# Patient Record
Sex: Female | Born: 1950 | Race: White | Hispanic: No | Marital: Single | Smoking: Never smoker
Health system: Southern US, Community
[De-identification: ages and names within clinical notes are randomized; demographics above are authoritative.]

## PROBLEM LIST (undated history)

## (undated) DIAGNOSIS — K219 Gastro-esophageal reflux disease without esophagitis: Secondary | ICD-10-CM

## (undated) DIAGNOSIS — S42209A Unspecified fracture of upper end of unspecified humerus, initial encounter for closed fracture: Secondary | ICD-10-CM

## (undated) DIAGNOSIS — I10 Essential (primary) hypertension: Secondary | ICD-10-CM

## (undated) DIAGNOSIS — J45909 Unspecified asthma, uncomplicated: Secondary | ICD-10-CM

## (undated) DIAGNOSIS — S52502A Unspecified fracture of the lower end of left radius, initial encounter for closed fracture: Secondary | ICD-10-CM

## (undated) DIAGNOSIS — Z923 Personal history of irradiation: Secondary | ICD-10-CM

## (undated) HISTORY — PX: BREAST SURGERY: SHX581

## (undated) HISTORY — PX: BREAST LUMPECTOMY: SHX2

## (undated) HISTORY — PX: CHOLECYSTECTOMY: SHX55

## (undated) HISTORY — PX: DILATION AND CURETTAGE OF UTERUS: SHX78

---

## 1997-09-26 ENCOUNTER — Other Ambulatory Visit: Admission: RE | Admit: 1997-09-26 | Discharge: 1997-09-26 | Payer: Self-pay | Admitting: Obstetrics and Gynecology

## 1998-04-28 HISTORY — PX: BREAST LUMPECTOMY: SHX2

## 1998-09-18 ENCOUNTER — Other Ambulatory Visit: Admission: RE | Admit: 1998-09-18 | Discharge: 1998-09-18 | Payer: Self-pay | Admitting: Radiology

## 1998-10-02 ENCOUNTER — Other Ambulatory Visit: Admission: RE | Admit: 1998-10-02 | Discharge: 1998-10-02 | Payer: Self-pay | Admitting: Obstetrics and Gynecology

## 1998-10-02 ENCOUNTER — Other Ambulatory Visit: Admission: RE | Admit: 1998-10-02 | Discharge: 1998-10-02 | Payer: Self-pay | Admitting: *Deleted

## 1998-10-11 ENCOUNTER — Ambulatory Visit (HOSPITAL_COMMUNITY): Admission: RE | Admit: 1998-10-11 | Discharge: 1998-10-11 | Payer: Self-pay | Admitting: *Deleted

## 1998-11-02 ENCOUNTER — Ambulatory Visit (HOSPITAL_COMMUNITY): Admission: RE | Admit: 1998-11-02 | Discharge: 1998-11-02 | Payer: Self-pay | Admitting: *Deleted

## 1998-12-18 ENCOUNTER — Encounter: Admission: RE | Admit: 1998-12-18 | Discharge: 1999-03-18 | Payer: Self-pay | Admitting: *Deleted

## 1999-10-03 ENCOUNTER — Other Ambulatory Visit: Admission: RE | Admit: 1999-10-03 | Discharge: 1999-10-03 | Payer: Self-pay | Admitting: Obstetrics and Gynecology

## 1999-11-13 ENCOUNTER — Ambulatory Visit (HOSPITAL_COMMUNITY): Admission: RE | Admit: 1999-11-13 | Discharge: 1999-11-13 | Payer: Self-pay | Admitting: Obstetrics and Gynecology

## 1999-11-13 ENCOUNTER — Encounter (INDEPENDENT_AMBULATORY_CARE_PROVIDER_SITE_OTHER): Payer: Self-pay | Admitting: Specialist

## 2000-05-20 ENCOUNTER — Ambulatory Visit (HOSPITAL_COMMUNITY): Admission: RE | Admit: 2000-05-20 | Discharge: 2000-05-20 | Payer: Self-pay | Admitting: Internal Medicine

## 2000-08-20 ENCOUNTER — Encounter: Payer: Self-pay | Admitting: Hematology and Oncology

## 2000-08-20 ENCOUNTER — Ambulatory Visit (HOSPITAL_COMMUNITY): Admission: RE | Admit: 2000-08-20 | Discharge: 2000-08-20 | Payer: Self-pay | Admitting: Hematology and Oncology

## 2000-10-13 ENCOUNTER — Other Ambulatory Visit: Admission: RE | Admit: 2000-10-13 | Discharge: 2000-10-13 | Payer: Self-pay | Admitting: Obstetrics and Gynecology

## 2001-10-19 ENCOUNTER — Other Ambulatory Visit: Admission: RE | Admit: 2001-10-19 | Discharge: 2001-10-19 | Payer: Self-pay | Admitting: Obstetrics and Gynecology

## 2002-01-04 ENCOUNTER — Ambulatory Visit (HOSPITAL_COMMUNITY): Admission: RE | Admit: 2002-01-04 | Discharge: 2002-01-04 | Payer: Self-pay | Admitting: *Deleted

## 2002-11-09 ENCOUNTER — Other Ambulatory Visit: Admission: RE | Admit: 2002-11-09 | Discharge: 2002-11-09 | Payer: Self-pay | Admitting: Obstetrics and Gynecology

## 2003-10-06 ENCOUNTER — Ambulatory Visit (HOSPITAL_COMMUNITY): Admission: RE | Admit: 2003-10-06 | Discharge: 2003-10-06 | Payer: Self-pay | Admitting: Oncology

## 2003-10-19 ENCOUNTER — Encounter: Admission: RE | Admit: 2003-10-19 | Discharge: 2003-10-19 | Payer: Self-pay | Admitting: Family Medicine

## 2003-11-09 ENCOUNTER — Other Ambulatory Visit: Admission: RE | Admit: 2003-11-09 | Discharge: 2003-11-09 | Payer: Self-pay | Admitting: Obstetrics and Gynecology

## 2003-12-11 ENCOUNTER — Ambulatory Visit (HOSPITAL_COMMUNITY): Admission: RE | Admit: 2003-12-11 | Discharge: 2003-12-11 | Payer: Self-pay | Admitting: *Deleted

## 2003-12-11 ENCOUNTER — Encounter (INDEPENDENT_AMBULATORY_CARE_PROVIDER_SITE_OTHER): Payer: Self-pay | Admitting: Specialist

## 2004-11-13 ENCOUNTER — Other Ambulatory Visit: Admission: RE | Admit: 2004-11-13 | Discharge: 2004-11-13 | Payer: Self-pay | Admitting: Obstetrics and Gynecology

## 2006-09-10 ENCOUNTER — Other Ambulatory Visit: Admission: RE | Admit: 2006-09-10 | Discharge: 2006-09-10 | Payer: Self-pay | Admitting: Family Medicine

## 2007-09-13 ENCOUNTER — Other Ambulatory Visit: Admission: RE | Admit: 2007-09-13 | Discharge: 2007-09-13 | Payer: Self-pay | Admitting: Family Medicine

## 2008-09-18 ENCOUNTER — Other Ambulatory Visit: Admission: RE | Admit: 2008-09-18 | Discharge: 2008-09-18 | Payer: Self-pay | Admitting: Family Medicine

## 2009-09-18 ENCOUNTER — Other Ambulatory Visit: Admission: RE | Admit: 2009-09-18 | Discharge: 2009-09-18 | Payer: Self-pay | Admitting: Family Medicine

## 2010-08-01 ENCOUNTER — Other Ambulatory Visit: Payer: Self-pay | Admitting: Family Medicine

## 2010-08-01 DIAGNOSIS — Z9889 Other specified postprocedural states: Secondary | ICD-10-CM

## 2010-08-01 DIAGNOSIS — Z1231 Encounter for screening mammogram for malignant neoplasm of breast: Secondary | ICD-10-CM

## 2010-09-13 NOTE — Op Note (Signed)
NAMECHRISTINE, Theresa Booth                            ACCOUNT NO.:  [id number]   MEDICAL RECORD NO.:  [id number]                   PATIENT TYPE:  AMB   LOCATION:  DAY                                  FACILITY:  [facility]   PHYSICIAN:  Vikki Ports, M.D.         DATE OF BIRTH:  03/31/1951   DATE OF PROCEDURE:  12/11/2003  DATE OF DISCHARGE:                                 OPERATIVE REPORT   PREOPERATIVE DIAGNOSES:  1. Elevated liver function tests.  2. Cholelithiasis.   POSTOPERATIVE DIAGNOSES:  1. Elevated liver function tests.  2. Cholelithiasis.  3. Normal cholangiogram.   PROCEDURE:  Laparoscopic cholecystectomy with intraoperative cholangiogram.   SURGEON:  Vikki Ports, M.D.   ASSISTANT:  Marcy Panning, M.D.   ANESTHESIA:  General.   DESCRIPTION OF PROCEDURE:  The patient was taken to the operating room,  placed in supine position.  After adequate general anesthesia was induced  using endotracheal tube, the abdomen was prepped and draped in the normal  sterile fashion.  Using a transverse infraumbilical incision, I dissected  down to the fascia.  Fascia was opened vertically.  An 0 Vicryl pursestring  suture was placed around the fascial defect.  Hasson trocar was placed in  the abdomen, and the abdomen was insufflated with carbon dioxide.  Under  direct visualization, a 10 mm port was placed in the subxiphoid region; two  5 mm ports were placed in the right abdomen.  Gallbladder was identified and  retracted cephalad.  Beginning at the neck of the gallbladder, the cystic  duct was dissected free.  A good window was created around it.  A clip was  placed proximally at the neck of the gallbladder.  A small ductotomy was  made, and cholangiocatheter was introduced.  Cholangiogram was performed  which normal common bile duct diameter, free flow into the duodenum, and  normal hepatic ducts.  Catheter was removed.  Cystic duct was doubly clipped  and divided.  Cystic  artery was dissected in a similar fashion, triply  clipped, and divided.  Gallbladder was taken off the gallbladder bed using  Bovie electrocautery and removed through the umbilical port.  Adequate  hemostasis was ensured.  Pneumoperitoneum was released; trocars were  removed.  The fascial defect was closed with the 0 Vicryl pursestring  suture.  Skin incisions were closed with subcuticular 4-0 Monocryl.  Steri-  Strips and sterile dressings were applied.  The patient tolerated the  procedure well and went to PACU in good condition.                                               Vikki Ports, M.D.    KRH/MEDQ  D:  12/12/2003  T:  12/12/2003  Job:  [id number]

## 2010-09-13 NOTE — Op Note (Signed)
[facility]  Patient:    Theresa Booth, Theresa Booth                 MRN: [id number] Proc. Date: 11/13/99 Adm. Date:  [id number] Attending:  Morene Antu                           Operative Report  PREOPERATIVE DIAGNOSIS:  Tamoxifen therapy, thickened endometrium.  POSTOPERATIVE DIAGNOSIS:  Tamoxifen therapy, thickened endometrium. Endometrial uterine septum.  OPERATION:  SURGEON:  Sherry A. Rosalio Macadamia, M.D.  ASSISTANT:  ANESTHESIA:  MAC.  INDICATIONS:  The patient is a 60 year old G0, P0 woman who has been on Tamoxifen therapy for approximately one year.  The patient had a sonohistogram performed in the office which was slightly difficult to perform secondary to cervical stenosis.  This was performed and a probable endometrial polyp was seen.  Because of this, the patient is brought to the operating room for [facility] hysteroscopy with resectoscope.  FINDINGS:  Normal-sized anteflexed uterus, no adenexal mass.  No polyp present but broad uterine septum present.  DESCRIPTION OF PROCEDURE:  The patient was brought to the operating room and given adequate IV sedation.  She was placed in dorsal lithotomy position. Pelvic examination was performed.  Perineum was washed with Betadine.  The patient was draped in sterile fashion.  Speculum was placed within the vagina. The vagina was washed with Betadine.  Paracervical block was administered with 1% Nesacaine.  The anterior lip of the cervix was grasped with a single-toothed tenaculum.  The cervix was sounded and the cervix was dilated with Shawnie Pons dilators to a #31.  The hysteroscope was introduced into the endometrial cavity.  Pictures were obtained.  No polyp could be seen but a broadbased uterine septum was present.  Using a double loop right angle resector, samples of endometrial tissue and uterine septum were obtained. Small bleeders were cauterized.  Adequate hemostasis was present.   The tenaculum and resectoscope was removed from the vagina.  No significant bleeding was present.  All instruments were removed from the vagina.  The patient was taken out of the dorsal lithotomy position.  She was awakened. She was moved from the operating table to a stretcher in stable condition.  COMPLICATIONS:  None.  ESTIMATED BLOOD LOSS:  Less than 5 cc.  SORBITOL DIFFERENTIAL:  -20. DD:  11/13/99 TD:  11/14/99 Job: [id number]

## 2010-10-11 ENCOUNTER — Ambulatory Visit
Admission: RE | Admit: 2010-10-11 | Discharge: 2010-10-11 | Disposition: A | Discharge status: Home / Self Care | Payer: 59 | Source: Ambulatory Visit | Attending: Family Medicine | Admitting: Family Medicine

## 2010-10-11 DIAGNOSIS — Z1231 Encounter for screening mammogram for malignant neoplasm of breast: Secondary | ICD-10-CM

## 2010-10-11 DIAGNOSIS — Z9889 Other specified postprocedural states: Secondary | ICD-10-CM

## 2011-05-07 ENCOUNTER — Other Ambulatory Visit: Payer: Self-pay | Admitting: Family Medicine

## 2011-05-07 DIAGNOSIS — Z1231 Encounter for screening mammogram for malignant neoplasm of breast: Secondary | ICD-10-CM

## 2011-10-13 ENCOUNTER — Ambulatory Visit
Admission: RE | Admit: 2011-10-13 | Discharge: 2011-10-13 | Disposition: A | Discharge status: Home / Self Care | Payer: 59 | Source: Ambulatory Visit | Attending: Family Medicine | Admitting: Family Medicine

## 2011-10-13 DIAGNOSIS — Z1231 Encounter for screening mammogram for malignant neoplasm of breast: Secondary | ICD-10-CM

## 2012-04-08 ENCOUNTER — Other Ambulatory Visit: Payer: Self-pay | Admitting: Family Medicine

## 2012-04-08 DIAGNOSIS — Z1231 Encounter for screening mammogram for malignant neoplasm of breast: Secondary | ICD-10-CM

## 2012-09-23 ENCOUNTER — Other Ambulatory Visit: Payer: Self-pay | Admitting: Family Medicine

## 2012-09-23 ENCOUNTER — Other Ambulatory Visit (HOSPITAL_COMMUNITY)
Admission: RE | Admit: 2012-09-23 | Discharge: 2012-09-23 | Disposition: A | Discharge status: Home / Self Care | Payer: 59 | Source: Ambulatory Visit | Attending: Family Medicine | Admitting: Family Medicine

## 2012-09-23 DIAGNOSIS — Z124 Encounter for screening for malignant neoplasm of cervix: Secondary | ICD-10-CM | POA: Insufficient documentation

## 2012-10-13 ENCOUNTER — Ambulatory Visit: Payer: 59

## 2012-10-14 ENCOUNTER — Ambulatory Visit
Admission: RE | Admit: 2012-10-14 | Discharge: 2012-10-14 | Disposition: A | Discharge status: Home / Self Care | Payer: 59 | Source: Ambulatory Visit | Attending: Family Medicine | Admitting: Family Medicine

## 2012-10-14 DIAGNOSIS — Z1231 Encounter for screening mammogram for malignant neoplasm of breast: Secondary | ICD-10-CM

## 2013-07-25 ENCOUNTER — Other Ambulatory Visit: Payer: Self-pay

## 2013-07-25 DIAGNOSIS — Z853 Personal history of malignant neoplasm of breast: Secondary | ICD-10-CM

## 2013-07-25 DIAGNOSIS — Z1231 Encounter for screening mammogram for malignant neoplasm of breast: Secondary | ICD-10-CM

## 2013-07-25 DIAGNOSIS — Z9889 Other specified postprocedural states: Secondary | ICD-10-CM

## 2013-10-17 ENCOUNTER — Ambulatory Visit
Admission: RE | Admit: 2013-10-17 | Discharge: 2013-10-17 | Disposition: A | Discharge status: Home / Self Care | Payer: BC Managed Care – PPO | Source: Ambulatory Visit

## 2013-10-17 ENCOUNTER — Encounter (INDEPENDENT_AMBULATORY_CARE_PROVIDER_SITE_OTHER): Payer: Self-pay

## 2013-10-17 DIAGNOSIS — Z853 Personal history of malignant neoplasm of breast: Secondary | ICD-10-CM

## 2013-10-17 DIAGNOSIS — Z9889 Other specified postprocedural states: Secondary | ICD-10-CM

## 2013-10-17 DIAGNOSIS — Z1231 Encounter for screening mammogram for malignant neoplasm of breast: Secondary | ICD-10-CM

## 2014-09-18 ENCOUNTER — Other Ambulatory Visit: Payer: Self-pay

## 2014-09-18 DIAGNOSIS — Z1231 Encounter for screening mammogram for malignant neoplasm of breast: Secondary | ICD-10-CM

## 2014-10-19 ENCOUNTER — Ambulatory Visit
Admission: RE | Admit: 2014-10-19 | Discharge: 2014-10-19 | Disposition: A | Discharge status: Home / Self Care | Payer: BLUE CROSS/BLUE SHIELD | Source: Ambulatory Visit

## 2014-10-19 DIAGNOSIS — Z1231 Encounter for screening mammogram for malignant neoplasm of breast: Secondary | ICD-10-CM

## 2014-10-25 ENCOUNTER — Other Ambulatory Visit: Payer: Self-pay

## 2014-10-25 DIAGNOSIS — Z9889 Other specified postprocedural states: Secondary | ICD-10-CM

## 2014-10-25 DIAGNOSIS — Z1231 Encounter for screening mammogram for malignant neoplasm of breast: Secondary | ICD-10-CM

## 2014-10-25 DIAGNOSIS — Z853 Personal history of malignant neoplasm of breast: Secondary | ICD-10-CM

## 2015-05-09 DIAGNOSIS — H40033 Anatomical narrow angle, bilateral: Secondary | ICD-10-CM | POA: Diagnosis not present

## 2015-05-09 DIAGNOSIS — H2513 Age-related nuclear cataract, bilateral: Secondary | ICD-10-CM | POA: Diagnosis not present

## 2015-09-08 ENCOUNTER — Encounter (HOSPITAL_COMMUNITY): Payer: Self-pay | Admitting: Emergency Medicine

## 2015-09-08 ENCOUNTER — Ambulatory Visit (HOSPITAL_COMMUNITY)
Admission: EM | Admit: 2015-09-08 | Discharge: 2015-09-08 | Disposition: A | Discharge status: Nursing Home (Includes ICF) | Payer: PPO | Attending: Emergency Medicine | Admitting: Emergency Medicine

## 2015-09-08 ENCOUNTER — Ambulatory Visit (INDEPENDENT_AMBULATORY_CARE_PROVIDER_SITE_OTHER): Payer: PPO

## 2015-09-08 ENCOUNTER — Encounter (HOSPITAL_COMMUNITY): Payer: Self-pay

## 2015-09-08 ENCOUNTER — Emergency Department (HOSPITAL_COMMUNITY)
Admission: EM | Admit: 2015-09-08 | Discharge: 2015-09-08 | Disposition: A | Discharge status: Home / Self Care | Payer: PPO | Attending: Emergency Medicine | Admitting: Emergency Medicine

## 2015-09-08 ENCOUNTER — Emergency Department (HOSPITAL_COMMUNITY): Payer: PPO

## 2015-09-08 DIAGNOSIS — Y9289 Other specified places as the place of occurrence of the external cause: Secondary | ICD-10-CM | POA: Insufficient documentation

## 2015-09-08 DIAGNOSIS — S52612A Displaced fracture of left ulna styloid process, initial encounter for closed fracture: Secondary | ICD-10-CM | POA: Diagnosis not present

## 2015-09-08 DIAGNOSIS — S42292A Other displaced fracture of upper end of left humerus, initial encounter for closed fracture: Secondary | ICD-10-CM | POA: Diagnosis not present

## 2015-09-08 DIAGNOSIS — S299XXA Unspecified injury of thorax, initial encounter: Secondary | ICD-10-CM | POA: Diagnosis not present

## 2015-09-08 DIAGNOSIS — S52502A Unspecified fracture of the lower end of left radius, initial encounter for closed fracture: Secondary | ICD-10-CM | POA: Diagnosis not present

## 2015-09-08 DIAGNOSIS — S52592A Other fractures of lower end of left radius, initial encounter for closed fracture: Secondary | ICD-10-CM | POA: Diagnosis not present

## 2015-09-08 DIAGNOSIS — W07XXXA Fall from chair, initial encounter: Secondary | ICD-10-CM | POA: Insufficient documentation

## 2015-09-08 DIAGNOSIS — Z791 Long term (current) use of non-steroidal anti-inflammatories (NSAID): Secondary | ICD-10-CM | POA: Insufficient documentation

## 2015-09-08 DIAGNOSIS — Y998 Other external cause status: Secondary | ICD-10-CM | POA: Diagnosis not present

## 2015-09-08 DIAGNOSIS — Z79899 Other long term (current) drug therapy: Secondary | ICD-10-CM | POA: Insufficient documentation

## 2015-09-08 DIAGNOSIS — Z7952 Long term (current) use of systemic steroids: Secondary | ICD-10-CM | POA: Insufficient documentation

## 2015-09-08 DIAGNOSIS — S42202A Unspecified fracture of upper end of left humerus, initial encounter for closed fracture: Secondary | ICD-10-CM | POA: Diagnosis not present

## 2015-09-08 DIAGNOSIS — S62102A Fracture of unspecified carpal bone, left wrist, initial encounter for closed fracture: Secondary | ICD-10-CM | POA: Diagnosis not present

## 2015-09-08 DIAGNOSIS — S59902A Unspecified injury of left elbow, initial encounter: Secondary | ICD-10-CM | POA: Diagnosis not present

## 2015-09-08 DIAGNOSIS — S42209A Unspecified fracture of upper end of unspecified humerus, initial encounter for closed fracture: Secondary | ICD-10-CM

## 2015-09-08 DIAGNOSIS — S52602A Unspecified fracture of lower end of left ulna, initial encounter for closed fracture: Secondary | ICD-10-CM

## 2015-09-08 DIAGNOSIS — S42212A Unspecified displaced fracture of surgical neck of left humerus, initial encounter for closed fracture: Secondary | ICD-10-CM | POA: Diagnosis not present

## 2015-09-08 DIAGNOSIS — Z9889 Other specified postprocedural states: Secondary | ICD-10-CM

## 2015-09-08 DIAGNOSIS — M25532 Pain in left wrist: Secondary | ICD-10-CM

## 2015-09-08 DIAGNOSIS — S42302A Unspecified fracture of shaft of humerus, left arm, initial encounter for closed fracture: Secondary | ICD-10-CM | POA: Diagnosis not present

## 2015-09-08 DIAGNOSIS — S52622A Torus fracture of lower end of left ulna, initial encounter for closed fracture: Secondary | ICD-10-CM | POA: Diagnosis not present

## 2015-09-08 DIAGNOSIS — Y9389 Activity, other specified: Secondary | ICD-10-CM | POA: Diagnosis not present

## 2015-09-08 DIAGNOSIS — IMO0001 Reserved for inherently not codable concepts without codable children: Secondary | ICD-10-CM

## 2015-09-08 DIAGNOSIS — S6992XA Unspecified injury of left wrist, hand and finger(s), initial encounter: Secondary | ICD-10-CM | POA: Diagnosis not present

## 2015-09-08 HISTORY — DX: Unspecified fracture of upper end of unspecified humerus, initial encounter for closed fracture: S42.209A

## 2015-09-08 HISTORY — DX: Unspecified fracture of the lower end of left radius, initial encounter for closed fracture: S52.502A

## 2015-09-08 LAB — BASIC METABOLIC PANEL
Anion gap: 13 (ref 5–15)
BUN: 18 mg/dL (ref 6–20)
CHLORIDE: 103 mmol/L (ref 101–111)
CO2: 20 mmol/L — AB (ref 22–32)
CREATININE: 0.75 mg/dL (ref 0.44–1.00)
Calcium: 8.8 mg/dL — ABNORMAL LOW (ref 8.9–10.3)
GFR calc non Af Amer: >60 mL/min (ref >60)
Glucose, Bld: 189 mg/dL — ABNORMAL HIGH (ref 65–99)
Potassium: 3.1 mmol/L — ABNORMAL LOW (ref 3.5–5.1)
Sodium: 136 mmol/L (ref 135–145)

## 2015-09-08 LAB — APTT: aPTT: 26 seconds (ref 24–37)

## 2015-09-08 LAB — CBC
HEMATOCRIT: 39.8 % (ref 36.0–46.0)
HEMOGLOBIN: 14.2 g/dL (ref 12.0–15.0)
MCH: 28.8 pg (ref 26.0–34.0)
MCHC: 35.7 g/dL (ref 30.0–36.0)
MCV: 80.7 fL (ref 78.0–100.0)
Platelets: 196 10*3/uL (ref 150–400)
RBC: 4.93 MIL/uL (ref 3.87–5.11)
RDW: 13.6 % (ref 11.5–15.5)
WBC: 13.3 10*3/uL — ABNORMAL HIGH (ref 4.0–10.5)

## 2015-09-08 LAB — PROTIME-INR
INR: 1.06 (ref 0.00–1.49)
Prothrombin Time: 14 seconds (ref 11.6–15.2)

## 2015-09-08 MED ORDER — HYDROMORPHONE HCL 1 MG/ML IJ SOLN
1.0000 mg | Freq: Once | INTRAMUSCULAR | Status: AC
  Administered 2015-09-08: 1 mg via INTRAVENOUS
  Filled 2015-09-08: qty 1

## 2015-09-08 MED ORDER — FENTANYL CITRATE (PF) 100 MCG/2ML IJ SOLN
50.0000 ug | INTRAMUSCULAR | Status: DC | PRN
  Administered 2015-09-08: 50 ug via INTRAVENOUS
  Filled 2015-09-08: qty 2

## 2015-09-08 MED ORDER — HYDROMORPHONE HCL 1 MG/ML IJ SOLN
INTRAMUSCULAR | Status: AC
  Filled 2015-09-08: qty 2

## 2015-09-08 MED ORDER — MORPHINE SULFATE (PF) 2 MG/ML IV SOLN
2.0000 mg | INTRAVENOUS | Status: DC | PRN
  Administered 2015-09-08: 2 mg via INTRAVENOUS

## 2015-09-08 MED ORDER — OXYCODONE-ACETAMINOPHEN 5-325 MG PO TABS: 1.0000 | ORAL_TABLET | ORAL | Status: AC | PRN

## 2015-09-08 MED ORDER — MORPHINE SULFATE (PF) 2 MG/ML IV SOLN
INTRAVENOUS | Status: AC
  Filled 2015-09-08: qty 1

## 2015-09-08 MED ORDER — HYDROMORPHONE HCL 1 MG/ML IJ SOLN
1.0000 mg | Freq: Once | INTRAMUSCULAR | Status: AC
Start: 2015-09-08 — End: 2015-09-08
  Administered 2015-09-08: 1 mg via INTRAVENOUS
  Filled 2015-09-08: qty 1

## 2015-09-08 MED ORDER — SODIUM CHLORIDE 0.9% FLUSH
INTRAVENOUS | Status: AC
  Filled 2015-09-08: qty 30

## 2015-09-08 MED ORDER — LIDOCAINE HCL (PF) 1 % IJ SOLN
10.0000 mL | Freq: Once | INTRAMUSCULAR | Status: AC
  Administered 2015-09-08: 10 mL
  Filled 2015-09-08: qty 10

## 2015-09-08 NOTE — ED Notes (Signed)
Pt UCC TX for left humerus and wrist fracture.  Pt was given Morphine aprox. 1 hr ago by [facility] but pt appears to still be in a great deal of pain.  Pt fell off of a step ladder injuring her left arm.

## 2015-09-08 NOTE — Progress Notes (Signed)
Orthopedic Tech Progress Note Patient Details:  Theresa Booth May 08, 1950 [id number]  Ortho Devices Type of Ortho Device: Ace wrap, Arm sling, Sugartong splint Ortho Device/Splint Location: LUE Ortho Device/Splint Interventions: Ordered, Application   Braulio Bosch 09/08/2015, 9:47 PM

## 2015-09-08 NOTE — Discharge Instructions (Signed)
GO TO EMERGENCY DEPARTMENT FOR FURTHER X-RAYS AND ADDITIONAL ANALGESIA

## 2015-09-08 NOTE — Progress Notes (Signed)
Orthopedic Tech Progress Note Patient Details:  Theresa Booth Jul 09, 1950 [id number]  Ortho Devices Type of Ortho Device: Ace wrap, Coapt Ortho Device/Splint Location: LUE Ortho Device/Splint Interventions: Ordered, Application   Braulio Bosch 09/08/2015, 10:17 PM

## 2015-09-08 NOTE — Discharge Instructions (Addendum)
Discharge Instructions   You have a dressing with a plaster splint incorporated in it.  The sling is for elevation of your hand and also for the sake of your shoulder fracture Move your fingers as much as possible, making a full fist and fully opening the fist. Elevate your hand to reduce pain & swelling of the digits.  Ice over the operative site may be helpful to reduce pain & swelling.  DO NOT USE HEAT. Pain medicine has been prescribed for you.  Use your medicine as needed over the first 48 hours, and then you can begin to taper your use.  You may use Tylenol in place of your prescribed pain medication, but not IN ADDITION to it. Leave the dressing in place until you return to our office.  You may shower, but keep the bandage clean & dry.  You may drive a car when you are off of prescription pain medications and can safely control your vehicle with both hands. Our office will call you to arrange follow-up   Please call [phone] during normal business hours or [phone] after hours for any problems. Including the following:  - excessive redness of the incisions - drainage for more than 4 days - fever of more than 101.5 F  *Please note that pain medications will not be refilled after hours or on weekends.

## 2015-09-08 NOTE — ED Provider Notes (Signed)
CSN: [id number]     Arrival date & time 09/08/15  1646 History   First MD Initiated Contact with Patient 09/08/15 1709     Chief Complaint  Patient presents with  . Shoulder Injury   (Consider location/radiation/quality/duration/timing/severity/associated sxs/prior Treatment) HPI History obtained from patient: Location: Left shoulder left wrist  Context/Duration: Fell from chair at approximately 3:00  Severity: 8   Quality: Aching sensation Timing:           Constant Home Treatment: Sling cold compress Associated symptoms:  None Family History: Social History: Nonsmoker  History reviewed. No pertinent past medical history. Past Surgical History  Procedure Laterality Date  . Breast surgery     No family history on file. Social History  Substance Use Topics  . Smoking status: Never Smoker   . Smokeless tobacco: None  . Alcohol Use: No   OB History    No data available     Review of Systems ROS +'ve left humerus and wrist injury from fall  Denies: HEADACHE, NAUSEA, ABDOMINAL PAIN, CHEST PAIN, CONGESTION, DYSURIA, SHORTNESS OF BREATH  Allergies  Review of patient's allergies indicates no known allergies.  Home Medications   Prior to Admission medications   Not on File   Meds Ordered and Administered this Visit   Medications  morphine 2 MG/ML injection 2 mg (2 mg Intravenous Given 09/08/15 1738)    BP 139/80 mmHg  Pulse 90  Temp(Src) 97.2 F (36.2 C) (Oral)  Resp 18  SpO2 99% No data found.   Physical Exam NURSES NOTES AND VITAL SIGNS REVIEWED. CONSTITUTIONAL: Well developed, well nourished, no acute distress HEENT: normocephalic, atraumatic EYES: Conjunctiva normal NECK:normal ROM, supple, no adenopathy PULMONARY:No respiratory distress, normal effort ABDOMINAL: Soft, ND, NT BS+, No CVAT MUSCULOSKELETAL: Left arm is in a makeshift sling tender to palpation over the upper humerus. Also tenderness of the left wrist. Sensory motor function are intact  distally.  SKIN: warm and dry without rash PSYCHIATRIC: Mood and affect, behavior are normal  ED Course  Procedures (including critical care time)  Labs Review Labs Reviewed - No data to display  Imaging Review Dg Humerus Left  09/08/2015  CLINICAL DATA:  Recent fall from chair with left shoulder pain, initial encounter EXAM: LEFT HUMERUS - 2+ VIEW COMPARISON:  None. FINDINGS: There is an oblique fracture through the proximal humeral shaft just below the surgical neck. Some mild impaction is noted at the fracture site. The more distal humerus is within normal limits. IMPRESSION: Oblique proximal humeral fracture. Electronically Signed   By: [name] M.D.   On: 09/08/2015 18:00    I HAVE PERSONALLY  REVIEWED AND DISCUSSED RESULTS OF  X-RAY WITH PATIENT AND FAMILY PRIOR TO DISCHARGE.    Visual Acuity Review  Right Eye Distance:   Left Eye Distance:   Bilateral Distance:    Right Eye Near:   Left Eye Near:    Bilateral Near:        Discussion with patient and her daughter about transport to the emergency department. They have declined ambulance transport and hospital transport but would prefer to go by private vehicle. Saline lock is to be left in place per emergency department. MDM   1. Humerus head fracture, left, closed, initial encounter   2. Wrist fracture, left, closed, initial encounter    PT NEEDSTo be seen in the emergency department for additional x-rays and analgesia. Discussion with on call orthopedist. He will see patient if emergency Department feels this necessary.  Konrad Felix, [place] 09/08/15 1818

## 2015-09-08 NOTE — ED Notes (Signed)
Ok'd by frank patrick to start IV lock on right arm w/hx of mastectomy back in 1999

## 2015-09-08 NOTE — Consult Note (Addendum)
ORTHOPAEDIC CONSULTATION HISTORY & PHYSICAL REQUESTING PHYSICIAN: Theresa Morrison, MD  Chief Complaint: left arm injury  HPI: Theresa Booth is a 65 y.o. female who fell off a chair earlier, landing upon her left UE.  Denies LOC, dizziness, etc. She was evaluated at [facility], [place] obtained, and she was transferred to the ED for further evaluation.  She c/o left shoulder and wrist pain, with bruising and deformity.  The on-call orthopedic surgeon has apparently already been consulted regarding her L proximal humerus fracture  History reviewed. No pertinent past medical history. Past Surgical History  Procedure Laterality Date  . Breast surgery     Social History   Social History  . Marital Status: Single    Spouse Name: N/A  . Number of Children: N/A  . Years of Education: N/A   Social History Main Topics  . Smoking status: Never Smoker   . Smokeless tobacco: None  . Alcohol Use: No  . Drug Use: No  . Sexual Activity: Not Asked   Other Topics Concern  . None   Social History Narrative   History reviewed. No pertinent family history. No Known Allergies Prior to Admission medications   Medication Sig Start Date End Date Taking? Authorizing Provider  ADVAIR DISKUS 100-50 MCG/DOSE AEPB Inhale 1 puff into the lungs daily.  08/13/15  Yes Historical Provider, MD  amLODipine (NORVASC) 5 MG tablet Take 5 mg by mouth daily. 08/06/15  Yes Historical Provider, MD  Calcium Carbonate-Vitamin D (CALCIUM-VITAMIN D) 500-200 MG-UNIT tablet Take 1 tablet by mouth 2 (two) times daily.   Yes Historical Provider, MD  fluticasone (FLONASE) 50 MCG/ACT nasal spray Place 2 sprays into both nostrils daily.   Yes Historical Provider, MD  ibuprofen (ADVIL,MOTRIN) 200 MG tablet Take 400 mg by mouth daily.   Yes Historical Provider, MD  Multiple Vitamin (MULTIVITAMIN) tablet Take 1 tablet by mouth daily.   Yes Historical Provider, MD  vitamin E 400 UNIT capsule Take 400 Units by mouth daily.   Yes  Historical Provider, MD  oxyCODONE-acetaminophen (ROXICET) 5-325 MG tablet Take 1-2 tablets by mouth every 4 (four) hours as needed. 09/08/15   Theresa Jakob, MD   Dg Chest 1 View  09/08/2015  CLINICAL DATA:  [place] on outstretched hand EXAM: CHEST 1 VIEW COMPARISON:  CT chest 10/05/2013 FINDINGS: Heart size and vascularity normal. Probable COPD. Negative for pneumonia. No infiltrate effusion or mass lesion. Lungs are clear. IMPRESSION: No active disease. Electronically Signed   By: Franchot Gallo M.D.   On: 09/08/2015 20:10   Dg Elbow 2 Views Left  09/08/2015  CLINICAL DATA:  [place] on outstretched hand. EXAM: LEFT ELBOW - 2 VIEW COMPARISON:  None. FINDINGS: There is no evidence of fracture, dislocation, or joint effusion. There is no evidence of arthropathy or other focal bone abnormality. Soft tissues are unremarkable. IMPRESSION: Negative. Electronically Signed   By: Franchot Gallo M.D.   On: 09/08/2015 20:12   Dg Forearm Left  09/08/2015  CLINICAL DATA:  [place] on outstretched hand EXAM: LEFT FOREARM - 2 VIEW COMPARISON:  None. FINDINGS: Fracture of the distal radius with dorsal displacement. Fracture of the ulnar styloid. No other fracture of the radius or ulna. IMPRESSION: Displaced fracture distal radius. Mildly displaced fracture ulnar styloid. Electronically Signed   By: Franchot Gallo M.D.   On: 09/08/2015 20:14   Dg Wrist 2 Views Left  09/08/2015  CLINICAL DATA:  [place] on outstretched hand EXAM: LEFT WRIST - 2 VIEW COMPARISON:  None. FINDINGS: Transverse  fracture distal radius with dorsal displacement of nearly 1 shaft width. Ulnar styloid fracture. Degenerative change at the base of thumb. IMPRESSION: Dorsally displaced fracture distal radius. Ulnar styloid also fractured. Electronically Signed   By: Franchot Gallo M.D.   On: 09/08/2015 20:14   Dg Hand 2 View Left  09/08/2015  CLINICAL DATA:  [place] on outstretched hand EXAM: LEFT HAND - 2 VIEW COMPARISON:  None. FINDINGS: Transverse fracture  distal radius with dorsal displacement. Fracture of the ulnar styloid Degenerative change moderate to advanced at base of thumb. Limited imaging of the hand due to positioning difficulty. No other fracture. IMPRESSION: Fracture distal radius and ulna. Electronically Signed   By: Franchot Gallo M.D.   On: 09/08/2015 20:15   Dg Shoulder Left  09/08/2015  CLINICAL DATA:  [place] on outstretched hand EXAM: LEFT SHOULDER - 2+ VIEW COMPARISON:  None. FINDINGS: Fracture of the proximal humeral diaphysis just below the humeral neck. 1/2 shaft width of medial displacement. Glenohumeral joint normal alignment. AC joint normal. IMPRESSION: Displaced fracture proximal left humeral shaft. Electronically Signed   By: Franchot Gallo M.D.   On: 09/08/2015 20:11   Dg Humerus Left  09/08/2015  CLINICAL DATA:  [place] on outstretched hand EXAM: LEFT HUMERUS - 2+ VIEW COMPARISON:  None. FINDINGS: Fracture of the proximal humeral shaft just below the humeral neck. Medial displacement. No other fracture the humerus. Normal shoulder joint IMPRESSION: Displaced fracture proximal humerus. Electronically Signed   By: Franchot Gallo M.D.   On: 09/08/2015 20:12   Dg Humerus Left  09/08/2015  CLINICAL DATA:  Recent fall from chair with left shoulder pain, initial encounter EXAM: LEFT HUMERUS - 2+ VIEW COMPARISON:  None. FINDINGS: There is an oblique fracture through the proximal humeral shaft just below the surgical neck. Some mild impaction is noted at the fracture site. The more distal humerus is within normal limits. IMPRESSION: Oblique proximal humeral fracture. Electronically Signed   By: Theresa Booth M.D.   On: 09/08/2015 18:00    Positive ROS: All other systems have been reviewed and were otherwise negative with the exception of those mentioned in the HPI and as above.  Physical Exam: Vitals: Refer to EMR. Constitutional:  WD, WN, NAD HEENT:  NCAT, EOMI Neuro/Psych:  Alert & oriented to person, place, and time; appropriate  mood & affect Lymphatic: No generalized extremity edema or lymphadenopathy Extremities / MSK:  The extremities are normal with respect to appearance, ranges of motion, joint stability, muscle strength/tone, sensation, & perfusion except as otherwise noted:  Left wrist with obviously dorsally and radially displaced distal radius fracture.  Intact LT sens R/M/U nerve distributions, with intact motor to same.  Radial pulse palpable, fingers warm, CR brisk  Assessment: 1. Comminuted, displaced very distal left distal radius fracture 2. Displaced left proximal humerus neck fx  Plan: I discussed these issues with her, and recommended closed reduction of the distal radius fracture in the emergency department with hematoma block, hopefully obtaining improvement sufficient for initial splint treatment.  She consented.  Hematoma block instilled with 10 mL's of 1% plain lidocaine by me.  After sufficient degree of anesthesia had been obtained, gentle manipulative closed reduction was performed and this was thought to be satisfactory for continued closed treatment at this point.  Sugar tong splint was applied and final images obtained.  Postreduction x-rays revealed significant improvement in alignment, still with some residual dorsal translational displacement, but with much better correction in the coronal plane and minimal if any dorsal  tilt.  Postreduction neurovascular exam unchanged.    Her distal radius fracture is sufficiently improved for her to be discharged from the emergency department with a sugar tong splint and perhaps the addition of a coaptation splint for her left proximal humerus fracture.  I will need to confer with the orthopedic surgeon who has been consulted regarding her left proximal humerus fracture to discuss a coordinated care plan for her left upper extremity.  I have prepared a Rx for Percocet for discharge.  My office will call her Monday regarding her next appointment with  me.  ADDENDUM: Spoke with Dr. Lyla Glassing, who agreed the consolidation of care was probably in this patient's best interest.  I will plan to see him care for her proximal humerus as well.  We will plan to apply a coaptation splint in the ER, and then likely address both surgically later this week.  Rayvon Char [address], [place], [place]  [id number] Office: [phone] Mobile: [phone]  09/08/2015, 9:27 PM

## 2015-09-08 NOTE — ED Provider Notes (Signed)
CSN: [id number]     Arrival date & time 09/08/15  1824 History   First MD Initiated Contact with Patient 09/08/15 1826     Chief Complaint  Patient presents with  . Extremity Pain     (Consider location/radiation/quality/duration/timing/severity/associated sxs/prior Treatment) Patient is a 65 y.o. female presenting with general illness. The history is provided by the patient.  Illness Location:  Fall Severity:  Moderate Onset quality:  Sudden Duration:  4 hours Timing:  Constant Progression:  Unchanged Chronicity:  New Context:  Standing on a chair, [place] injury to LUE.  Associated symptoms: no abdominal pain, no chest pain, no cough, no diarrhea, no fever, no headaches, no nausea, no shortness of breath and no vomiting     History reviewed. No pertinent past medical history. Past Surgical History  Procedure Laterality Date  . Breast surgery     History reviewed. No pertinent family history. Social History  Substance Use Topics  . Smoking status: Never Smoker   . Smokeless tobacco: None  . Alcohol Use: No   OB History    No data available     Review of Systems  Constitutional: Negative for fever and chills.  Respiratory: Negative for cough and shortness of breath.   Cardiovascular: Negative for chest pain.  Gastrointestinal: Negative for nausea, vomiting, abdominal pain and diarrhea.  Musculoskeletal: Negative for back pain and neck pain.  Skin: Negative for wound.  Neurological: Negative for light-headedness and headaches.  All other systems reviewed and are negative.     Allergies  Review of patient's allergies indicates no known allergies.  Home Medications   Prior to Admission medications   Medication Sig Start Date End Date Taking? Authorizing Provider  ADVAIR DISKUS 100-50 MCG/DOSE AEPB Inhale 1 puff into the lungs daily.  08/13/15  Yes Historical Provider, MD  amLODipine (NORVASC) 5 MG tablet Take 5 mg by mouth daily. 08/06/15  Yes Historical Provider,  MD  Calcium Carbonate-Vitamin D (CALCIUM-VITAMIN D) 500-200 MG-UNIT tablet Take 1 tablet by mouth 2 (two) times daily.   Yes Historical Provider, MD  fluticasone (FLONASE) 50 MCG/ACT nasal spray Place 2 sprays into both nostrils daily.   Yes Historical Provider, MD  ibuprofen (ADVIL,MOTRIN) 200 MG tablet Take 400 mg by mouth daily.   Yes Historical Provider, MD  Multiple Vitamin (MULTIVITAMIN) tablet Take 1 tablet by mouth daily.   Yes Historical Provider, MD  vitamin E 400 UNIT capsule Take 400 Units by mouth daily.   Yes Historical Provider, MD  oxyCODONE-acetaminophen (ROXICET) 5-325 MG tablet Take 1-2 tablets by mouth every 4 (four) hours as needed. 09/08/15   [name], MD   BP 163/99 mmHg  Pulse 112  Temp(Src) 99 F (37.2 C) (Oral)  Resp 22  Ht 5\' 6"  (1.676 m)  Wt 70.308 kg  BMI 25.03 kg/m2  SpO2 95% Physical Exam  Constitutional: She is oriented to person, place, and time. She appears well-developed and well-nourished. No distress.  HENT:  Head: Normocephalic and atraumatic.  Eyes: EOM are normal. Pupils are equal, round, and reactive to light.  Neck: Normal range of motion. No spinous process tenderness present.  Cardiovascular: Normal rate and regular rhythm.   Pulmonary/Chest: No tachypnea. No respiratory distress.  Abdominal: Soft. Normal appearance. There is no tenderness.  Musculoskeletal:       Left shoulder: She exhibits no tenderness.       Left elbow: No tenderness found.       Left wrist: She exhibits tenderness, bony tenderness, swelling and  deformity. She exhibits no laceration.       Cervical back: She exhibits no tenderness.       Thoracic back: She exhibits no tenderness.       Lumbar back: She exhibits no tenderness.       Left upper arm: She exhibits tenderness, swelling and deformity. She exhibits no laceration.       Left forearm: She exhibits no tenderness.  LUE: Neurovascularly intact distal to the gross deformities. Limited range of motion of  multiple digits secondary to pain; however all digits functioning. No open wounds.  Neurological: She is alert and oriented to person, place, and time. GCS eye subscore is 4. GCS verbal subscore is 5. GCS motor subscore is 6.  Skin: Skin is warm and dry.    ED Course  Procedures (including critical care time) Labs Review Labs Reviewed  CBC - Abnormal; Notable for the following:    WBC 13.3 (*)    All other components within normal limits  BASIC METABOLIC PANEL - Abnormal; Notable for the following:    Potassium 3.1 (*)    CO2 20 (*)    Glucose, Bld 189 (*)    Calcium 8.8 (*)    All other components within normal limits  PROTIME-INR  APTT    Imaging Review Dg Chest 1 View  09/08/2015  CLINICAL DATA:  [place] on outstretched hand EXAM: CHEST 1 VIEW COMPARISON:  CT chest 10/05/2013 FINDINGS: Heart size and vascularity normal. Probable COPD. Negative for pneumonia. No infiltrate effusion or mass lesion. Lungs are clear. IMPRESSION: No active disease. Electronically Signed   By: Franchot Gallo M.D.   On: 09/08/2015 20:10   Dg Elbow 2 Views Left  09/08/2015  CLINICAL DATA:  [place] on outstretched hand. EXAM: LEFT ELBOW - 2 VIEW COMPARISON:  None. FINDINGS: There is no evidence of fracture, dislocation, or joint effusion. There is no evidence of arthropathy or other focal bone abnormality. Soft tissues are unremarkable. IMPRESSION: Negative. Electronically Signed   By: Franchot Gallo M.D.   On: 09/08/2015 20:12   Dg Forearm Left  09/08/2015  CLINICAL DATA:  [place] on outstretched hand EXAM: LEFT FOREARM - 2 VIEW COMPARISON:  None. FINDINGS: Fracture of the distal radius with dorsal displacement. Fracture of the ulnar styloid. No other fracture of the radius or ulna. IMPRESSION: Displaced fracture distal radius. Mildly displaced fracture ulnar styloid. Electronically Signed   By: Franchot Gallo M.D.   On: 09/08/2015 20:14   Dg Wrist 2 Views Left  09/08/2015  CLINICAL DATA:  Left distal forearm  fractures EXAM: LEFT WRIST - COMPLETE 3+ VIEW COMPARISON:  09/08/2015 at 19:45 FINDINGS: There is significant improvement of alignment and position about the fractures of the distal radius and ulna. Overlying plaster obscures bony detail. IMPRESSION: Improved alignment and position on post reduction imaging. Electronically Signed   By: Andreas Newport M.D.   On: 09/08/2015 22:10   Dg Wrist 2 Views Left  09/08/2015  CLINICAL DATA:  [place] on outstretched hand EXAM: LEFT WRIST - 2 VIEW COMPARISON:  None. FINDINGS: Transverse fracture distal radius with dorsal displacement of nearly 1 shaft width. Ulnar styloid fracture. Degenerative change at the base of thumb. IMPRESSION: Dorsally displaced fracture distal radius. Ulnar styloid also fractured. Electronically Signed   By: Franchot Gallo M.D.   On: 09/08/2015 20:14   Dg Hand 2 View Left  09/08/2015  CLINICAL DATA:  [place] on outstretched hand EXAM: LEFT HAND - 2 VIEW COMPARISON:  None. FINDINGS: Transverse  fracture distal radius with dorsal displacement. Fracture of the ulnar styloid Degenerative change moderate to advanced at base of thumb. Limited imaging of the hand due to positioning difficulty. No other fracture. IMPRESSION: Fracture distal radius and ulna. Electronically Signed   By: Franchot Gallo M.D.   On: 09/08/2015 20:15   Dg Shoulder Left  09/08/2015  CLINICAL DATA:  [place] on outstretched hand EXAM: LEFT SHOULDER - 2+ VIEW COMPARISON:  None. FINDINGS: Fracture of the proximal humeral diaphysis just below the humeral neck. 1/2 shaft width of medial displacement. Glenohumeral joint normal alignment. AC joint normal. IMPRESSION: Displaced fracture proximal left humeral shaft. Electronically Signed   By: Franchot Gallo M.D.   On: 09/08/2015 20:11   Dg Humerus Left  09/08/2015  CLINICAL DATA:  [place] on outstretched hand EXAM: LEFT HUMERUS - 2+ VIEW COMPARISON:  None. FINDINGS: Fracture of the proximal humeral shaft just below the humeral neck. Medial  displacement. No other fracture the humerus. Normal shoulder joint IMPRESSION: Displaced fracture proximal humerus. Electronically Signed   By: Franchot Gallo M.D.   On: 09/08/2015 20:12   Dg Humerus Left  09/08/2015  CLINICAL DATA:  Recent fall from chair with left shoulder pain, initial encounter EXAM: LEFT HUMERUS - 2+ VIEW COMPARISON:  None. FINDINGS: There is an oblique fracture through the proximal humeral shaft just below the surgical neck. Some mild impaction is noted at the fracture site. The more distal humerus is within normal limits. IMPRESSION: Oblique proximal humeral fracture. Electronically Signed   By: [name] M.D.   On: 09/08/2015 18:00   I have personally reviewed and evaluated these images and lab results as part of my medical decision-making.   EKG Interpretation None      MDM   Final diagnoses:  Pain in left wrist  Radius and ulna distal fracture, left, closed, initial encounter  Proximal humeral fracture, left, closed, initial encounter  Status post closed reduction with internal fixation    65 year old female presenting after a [place] injury. Denied her head, did not lose consciousness. Doubt intracranial injury. Obvious gross deformity to both the proximal left arm as well as the distal forearm. Evidence of displaced fracture of the proximal humerus, as well as significantly displaced fracture of the radius and ulna. Neuro vasculature is intact. She does have some limitation to movement of the digits of her hand. Contacted orthopedics. Pending their recommendations. Will likely require admission for surgical intervention.  Orthopedics, Dr. Grandville Silos, evaluated the patient at bedside. He performs the reduction. Splint placed at bedside. Placed the patient in a sling for her proximal humeral fracture. Patient was neurovascularly intact after splint placement. Per the recommendations we'll discharge the patient home with pain control and follow up on an outpatient  basis for likely surgical intervention sometime next week.  Stricture percussion provided. Patient discharged in stable condition.  Maryan Puls, MD 09/09/15 [id number]  Elnora Morrison, MD 09/09/15 Curly Rim

## 2015-09-08 NOTE — ED Notes (Signed)
Pt reports she fell off a chair onto carpet flooring today around 1500 Unable to move arm due to pain A&O x4... No acute distress.

## 2015-09-08 NOTE — ED Notes (Signed)
Pt declined [facility] tx and wants to go by [facility]

## 2015-09-08 NOTE — ED Notes (Signed)
Patient transported to X-ray 

## 2015-09-11 ENCOUNTER — Other Ambulatory Visit: Payer: Self-pay | Admitting: Orthopedic Surgery

## 2015-09-12 ENCOUNTER — Encounter (HOSPITAL_BASED_OUTPATIENT_CLINIC_OR_DEPARTMENT_OTHER): Payer: Self-pay | Admitting: *Deleted

## 2015-09-13 ENCOUNTER — Other Ambulatory Visit: Payer: Self-pay

## 2015-09-13 ENCOUNTER — Encounter (HOSPITAL_BASED_OUTPATIENT_CLINIC_OR_DEPARTMENT_OTHER)
Admission: RE | Admit: 2015-09-13 | Discharge: 2015-09-13 | Disposition: A | Discharge status: Home / Self Care | Payer: PPO | Source: Ambulatory Visit | Attending: Orthopedic Surgery | Admitting: Orthopedic Surgery

## 2015-09-13 DIAGNOSIS — Z79899 Other long term (current) drug therapy: Secondary | ICD-10-CM | POA: Diagnosis not present

## 2015-09-13 DIAGNOSIS — M25512 Pain in left shoulder: Secondary | ICD-10-CM | POA: Diagnosis not present

## 2015-09-13 DIAGNOSIS — Z01810 Encounter for preprocedural cardiovascular examination: Secondary | ICD-10-CM | POA: Diagnosis not present

## 2015-09-13 DIAGNOSIS — I10 Essential (primary) hypertension: Secondary | ICD-10-CM | POA: Diagnosis not present

## 2015-09-13 DIAGNOSIS — Z7951 Long term (current) use of inhaled steroids: Secondary | ICD-10-CM | POA: Diagnosis not present

## 2015-09-13 DIAGNOSIS — S42202A Unspecified fracture of upper end of left humerus, initial encounter for closed fracture: Secondary | ICD-10-CM | POA: Diagnosis not present

## 2015-09-13 DIAGNOSIS — K219 Gastro-esophageal reflux disease without esophagitis: Secondary | ICD-10-CM | POA: Diagnosis not present

## 2015-09-13 DIAGNOSIS — J45909 Unspecified asthma, uncomplicated: Secondary | ICD-10-CM | POA: Diagnosis not present

## 2015-09-13 DIAGNOSIS — S52572A Other intraarticular fracture of lower end of left radius, initial encounter for closed fracture: Secondary | ICD-10-CM | POA: Diagnosis not present

## 2015-09-13 DIAGNOSIS — Y939 Activity, unspecified: Secondary | ICD-10-CM | POA: Diagnosis not present

## 2015-09-13 DIAGNOSIS — W07XXXA Fall from chair, initial encounter: Secondary | ICD-10-CM | POA: Diagnosis not present

## 2015-09-17 ENCOUNTER — Ambulatory Visit (HOSPITAL_COMMUNITY): Payer: PPO

## 2015-09-17 ENCOUNTER — Ambulatory Visit (HOSPITAL_BASED_OUTPATIENT_CLINIC_OR_DEPARTMENT_OTHER)
Admission: RE | Admit: 2015-09-17 | Discharge: 2015-09-17 | Disposition: A | Discharge status: Home / Self Care | Payer: PPO | Source: Ambulatory Visit | Attending: Orthopedic Surgery | Admitting: Orthopedic Surgery

## 2015-09-17 ENCOUNTER — Ambulatory Visit (HOSPITAL_BASED_OUTPATIENT_CLINIC_OR_DEPARTMENT_OTHER): Payer: PPO | Admitting: Certified Registered"

## 2015-09-17 ENCOUNTER — Encounter (HOSPITAL_BASED_OUTPATIENT_CLINIC_OR_DEPARTMENT_OTHER): Payer: Self-pay | Admitting: Certified Registered"

## 2015-09-17 ENCOUNTER — Encounter (HOSPITAL_BASED_OUTPATIENT_CLINIC_OR_DEPARTMENT_OTHER)
Admission: RE | Disposition: A | Discharge status: Home / Self Care | Payer: Self-pay | Source: Ambulatory Visit | Attending: Orthopedic Surgery

## 2015-09-17 DIAGNOSIS — Z79899 Other long term (current) drug therapy: Secondary | ICD-10-CM | POA: Diagnosis not present

## 2015-09-17 DIAGNOSIS — W07XXXA Fall from chair, initial encounter: Secondary | ICD-10-CM | POA: Insufficient documentation

## 2015-09-17 DIAGNOSIS — Z7951 Long term (current) use of inhaled steroids: Secondary | ICD-10-CM | POA: Diagnosis not present

## 2015-09-17 DIAGNOSIS — S52572A Other intraarticular fracture of lower end of left radius, initial encounter for closed fracture: Secondary | ICD-10-CM | POA: Insufficient documentation

## 2015-09-17 DIAGNOSIS — M79602 Pain in left arm: Secondary | ICD-10-CM | POA: Diagnosis not present

## 2015-09-17 DIAGNOSIS — Z4789 Encounter for other orthopedic aftercare: Secondary | ICD-10-CM

## 2015-09-17 DIAGNOSIS — I10 Essential (primary) hypertension: Secondary | ICD-10-CM | POA: Insufficient documentation

## 2015-09-17 DIAGNOSIS — Y939 Activity, unspecified: Secondary | ICD-10-CM | POA: Insufficient documentation

## 2015-09-17 DIAGNOSIS — G8918 Other acute postprocedural pain: Secondary | ICD-10-CM | POA: Diagnosis not present

## 2015-09-17 DIAGNOSIS — S42292A Other displaced fracture of upper end of left humerus, initial encounter for closed fracture: Secondary | ICD-10-CM | POA: Diagnosis not present

## 2015-09-17 DIAGNOSIS — Z419 Encounter for procedure for purposes other than remedying health state, unspecified: Secondary | ICD-10-CM

## 2015-09-17 DIAGNOSIS — S42202A Unspecified fracture of upper end of left humerus, initial encounter for closed fracture: Secondary | ICD-10-CM | POA: Insufficient documentation

## 2015-09-17 DIAGNOSIS — J45909 Unspecified asthma, uncomplicated: Secondary | ICD-10-CM | POA: Insufficient documentation

## 2015-09-17 DIAGNOSIS — S52502A Unspecified fracture of the lower end of left radius, initial encounter for closed fracture: Secondary | ICD-10-CM | POA: Diagnosis not present

## 2015-09-17 DIAGNOSIS — K219 Gastro-esophageal reflux disease without esophagitis: Secondary | ICD-10-CM | POA: Insufficient documentation

## 2015-09-17 HISTORY — PX: ORIF HUMERUS FRACTURE: SHX2126

## 2015-09-17 HISTORY — DX: Unspecified fracture of the lower end of left radius, initial encounter for closed fracture: S52.502A

## 2015-09-17 HISTORY — PX: OPEN REDUCTION INTERNAL FIXATION (ORIF) DISTAL RADIAL FRACTURE: SHX5989

## 2015-09-17 HISTORY — DX: Unspecified fracture of upper end of unspecified humerus, initial encounter for closed fracture: S42.209A

## 2015-09-17 HISTORY — DX: Unspecified asthma, uncomplicated: J45.909

## 2015-09-17 HISTORY — DX: Gastro-esophageal reflux disease without esophagitis: K21.9

## 2015-09-17 HISTORY — DX: Essential (primary) hypertension: I10

## 2015-09-17 SURGERY — OPEN REDUCTION INTERNAL FIXATION (ORIF) PROXIMAL HUMERUS FRACTURE
Anesthesia: Regional | Site: Wrist | Laterality: Left

## 2015-09-17 MED ORDER — BUPIVACAINE-EPINEPHRINE (PF) 0.5% -1:200000 IJ SOLN
INTRAMUSCULAR | Status: DC | PRN
  Administered 2015-09-17: 25 mL via PERINEURAL

## 2015-09-17 MED ORDER — SCOPOLAMINE 1 MG/3DAYS TD PT72: 1.0000 | MEDICATED_PATCH | Freq: Once | TRANSDERMAL | Status: DC | PRN

## 2015-09-17 MED ORDER — LABETALOL HCL 5 MG/ML IV SOLN
INTRAVENOUS | Status: AC
  Filled 2015-09-17: qty 4

## 2015-09-17 MED ORDER — HYDROCODONE-ACETAMINOPHEN 7.5-325 MG PO TABS: 1.0000 | ORAL_TABLET | Freq: Once | ORAL | Status: DC | PRN

## 2015-09-17 MED ORDER — CEFAZOLIN SODIUM-DEXTROSE 2-4 GM/100ML-% IV SOLN
2.0000 g | INTRAVENOUS | Status: AC
  Administered 2015-09-17: 2 g via INTRAVENOUS

## 2015-09-17 MED ORDER — LABETALOL HCL 5 MG/ML IV SOLN
5.0000 mg | INTRAVENOUS | Status: DC | PRN
  Administered 2015-09-17: 5 mg via INTRAVENOUS

## 2015-09-17 MED ORDER — PHENYLEPHRINE HCL 10 MG/ML IJ SOLN
INTRAMUSCULAR | Status: DC | PRN
  Administered 2015-09-17 (×6): 40 ug via INTRAVENOUS

## 2015-09-17 MED ORDER — DEXAMETHASONE SODIUM PHOSPHATE 4 MG/ML IJ SOLN
INTRAMUSCULAR | Status: DC | PRN
  Administered 2015-09-17: 10 mg via INTRAVENOUS

## 2015-09-17 MED ORDER — ONDANSETRON HCL 4 MG/2ML IJ SOLN
INTRAMUSCULAR | Status: AC
  Filled 2015-09-17: qty 2

## 2015-09-17 MED ORDER — LACTATED RINGERS IV SOLN
INTRAVENOUS | Status: DC
  Administered 2015-09-17 (×3): via INTRAVENOUS

## 2015-09-17 MED ORDER — LIDOCAINE HCL (CARDIAC) 20 MG/ML IV SOLN
INTRAVENOUS | Status: DC | PRN
  Administered 2015-09-17: 50 mg via INTRAVENOUS

## 2015-09-17 MED ORDER — SUCCINYLCHOLINE CHLORIDE 20 MG/ML IJ SOLN
INTRAMUSCULAR | Status: DC | PRN
  Administered 2015-09-17: 50 mg via INTRAVENOUS

## 2015-09-17 MED ORDER — ONDANSETRON HCL 4 MG/2ML IJ SOLN
INTRAMUSCULAR | Status: DC | PRN
  Administered 2015-09-17: 4 mg via INTRAVENOUS

## 2015-09-17 MED ORDER — PHENYLEPHRINE 40 MCG/ML (10ML) SYRINGE FOR IV PUSH (FOR BLOOD PRESSURE SUPPORT)
PREFILLED_SYRINGE | INTRAVENOUS | Status: AC
  Filled 2015-09-17: qty 10

## 2015-09-17 MED ORDER — CEFAZOLIN SODIUM-DEXTROSE 2-4 GM/100ML-% IV SOLN
INTRAVENOUS | Status: AC
  Filled 2015-09-17: qty 100

## 2015-09-17 MED ORDER — PROMETHAZINE HCL 25 MG/ML IJ SOLN: 6.2500 mg | INTRAMUSCULAR | Status: DC | PRN

## 2015-09-17 MED ORDER — MIDAZOLAM HCL 2 MG/2ML IJ SOLN
INTRAMUSCULAR | Status: AC
  Filled 2015-09-17: qty 2

## 2015-09-17 MED ORDER — FENTANYL CITRATE (PF) 100 MCG/2ML IJ SOLN
INTRAMUSCULAR | Status: AC
  Filled 2015-09-17: qty 2

## 2015-09-17 MED ORDER — PROPOFOL 10 MG/ML IV BOLUS
INTRAVENOUS | Status: DC | PRN
  Administered 2015-09-17: 40 mg via INTRAVENOUS
  Administered 2015-09-17: 110 mg via INTRAVENOUS
  Administered 2015-09-17: 50 mg via INTRAVENOUS

## 2015-09-17 MED ORDER — FENTANYL CITRATE (PF) 100 MCG/2ML IJ SOLN
50.0000 ug | INTRAMUSCULAR | Status: AC | PRN
  Administered 2015-09-17 (×3): 50 ug via INTRAVENOUS

## 2015-09-17 MED ORDER — HYDROMORPHONE HCL 1 MG/ML IJ SOLN: 0.2500 mg | INTRAMUSCULAR | Status: DC | PRN

## 2015-09-17 MED ORDER — MIDAZOLAM HCL 2 MG/2ML IJ SOLN
1.0000 mg | INTRAMUSCULAR | Status: DC | PRN
  Administered 2015-09-17: 1 mg via INTRAVENOUS

## 2015-09-17 MED ORDER — GLYCOPYRROLATE 0.2 MG/ML IJ SOLN: 0.2000 mg | Freq: Once | INTRAMUSCULAR | Status: DC | PRN

## 2015-09-17 MED ORDER — PROPOFOL 500 MG/50ML IV EMUL
INTRAVENOUS | Status: AC
  Filled 2015-09-17: qty 50

## 2015-09-17 SURGICAL SUPPLY — 128 items
APL SKNCLS STERI-STRIP NONHPOA (GAUZE/BANDAGES/DRESSINGS)
BANDAGE COBAN STERILE 2 (GAUZE/BANDAGES/DRESSINGS) IMPLANT
BENZOIN TINCTURE PRP APPL 2/3 (GAUZE/BANDAGES/DRESSINGS) IMPLANT
BIT DRILL 2 FAST STEP (BIT) ×2 IMPLANT
BIT DRILL 2.5X4 QC (BIT) ×2 IMPLANT
BIT DRILL 3.2 (BIT) ×8
BIT DRILL 3.2XCALB NS DISP (BIT) IMPLANT
BIT DRILL CALIBRATED 2.7 (BIT) ×1 IMPLANT
BIT DRILL CALIBRATED 2.7MM (BIT) ×1
BIT DRL 3.2XCALB NS DISP (BIT) ×4
BLADE MINI RND TIP GREEN BEAV (BLADE) IMPLANT
BLADE SURG 10 STRL SS (BLADE) ×4 IMPLANT
BLADE SURG 15 STRL LF DISP TIS (BLADE) ×2 IMPLANT
BLADE SURG 15 STRL SS (BLADE) ×4
BNDG CMPR 9X4 STRL LF SNTH (GAUZE/BANDAGES/DRESSINGS) ×2
BNDG COHESIVE 4X5 TAN STRL (GAUZE/BANDAGES/DRESSINGS) ×4 IMPLANT
BNDG ESMARK 4X9 LF (GAUZE/BANDAGES/DRESSINGS) ×4 IMPLANT
BNDG GAUZE ELAST 4 BULKY (GAUZE/BANDAGES/DRESSINGS) ×6 IMPLANT
BRUSH SCRUB EZ PLAIN DRY (MISCELLANEOUS) IMPLANT
CANISTER SUCT 1200ML W/VALVE (MISCELLANEOUS) ×4 IMPLANT
CHLORAPREP W/TINT 26ML (MISCELLANEOUS) ×4 IMPLANT
CLEANER CAUTERY TIP 5X5 PAD (MISCELLANEOUS) ×2 IMPLANT
CLOSURE WOUND 1/2 X4 (GAUZE/BANDAGES/DRESSINGS)
CORDS BIPOLAR (ELECTRODE) ×4 IMPLANT
COVER BACK TABLE 60X90IN (DRAPES) ×4 IMPLANT
COVER MAYO STAND STRL (DRAPES) ×4 IMPLANT
CUFF TOURN SGL LL 18 NRW (TOURNIQUET CUFF) ×2 IMPLANT
CUFF TOURNIQUET SINGLE 18IN (TOURNIQUET CUFF) IMPLANT
CUFF TOURNIQUET SINGLE 24IN (TOURNIQUET CUFF) IMPLANT
DRAPE C-ARM 42X72 X-RAY (DRAPES) ×6 IMPLANT
DRAPE EXTREMITY T 121X128X90 (DRAPE) ×4 IMPLANT
DRAPE IMP U-DRAPE 54X76 (DRAPES) IMPLANT
DRAPE SURG 17X23 STRL (DRAPES) ×4 IMPLANT
DRAPE U-SHAPE 47X51 STRL (DRAPES) ×4 IMPLANT
DRAPE U-SHAPE 76X120 STRL (DRAPES) ×8 IMPLANT
DRSG ADAPTIC 3X8 NADH LF (GAUZE/BANDAGES/DRESSINGS) ×4 IMPLANT
DRSG EMULSION OIL 3X3 NADH (GAUZE/BANDAGES/DRESSINGS) ×2 IMPLANT
DRSG PAD ABDOMINAL 8X10 ST (GAUZE/BANDAGES/DRESSINGS) ×4 IMPLANT
DRSG TEGADERM 4X4.75 (GAUZE/BANDAGES/DRESSINGS) IMPLANT
ELECT BLADE 6.5 .24CM SHAFT (ELECTRODE) IMPLANT
ELECT REM PT RETURN 9FT ADLT (ELECTROSURGICAL) ×4
ELECTRODE REM PT RTRN 9FT ADLT (ELECTROSURGICAL) ×2 IMPLANT
GAUZE SPONGE 4X4 12PLY STRL (GAUZE/BANDAGES/DRESSINGS) ×4 IMPLANT
GLOVE BIO SURGEON STRL SZ 6.5 (GLOVE) ×1 IMPLANT
GLOVE BIO SURGEON STRL SZ7.5 (GLOVE) ×6 IMPLANT
GLOVE BIO SURGEONS STRL SZ 6.5 (GLOVE) ×1
GLOVE BIOGEL PI IND STRL 7.0 (GLOVE) ×2 IMPLANT
GLOVE BIOGEL PI IND STRL 8 (GLOVE) ×2 IMPLANT
GLOVE BIOGEL PI INDICATOR 7.0 (GLOVE) ×6
GLOVE BIOGEL PI INDICATOR 8 (GLOVE) ×2
GLOVE ECLIPSE 6.5 STRL STRAW (GLOVE) ×6 IMPLANT
GOWN STRL REUS W/ TWL LRG LVL3 (GOWN DISPOSABLE) ×4 IMPLANT
GOWN STRL REUS W/TWL LRG LVL3 (GOWN DISPOSABLE) ×8
GOWN STRL REUS W/TWL XL LVL3 (GOWN DISPOSABLE) ×4 IMPLANT
K-WIRE 1.6 (WIRE) ×4
K-WIRE 2X5 SS THRDED S3 (WIRE) ×4
K-WIRE FX5X1.6XNS BN SS (WIRE) ×2
KWIRE 2X5 SS THRDED S3 (WIRE) IMPLANT
KWIRE FX5X1.6XNS BN SS (WIRE) IMPLANT
LIQUID BAND (GAUZE/BANDAGES/DRESSINGS) IMPLANT
NDL HYPO 25X1 1.5 SAFETY (NEEDLE) IMPLANT
NEEDLE HYPO 25X1 1.5 SAFETY (NEEDLE) IMPLANT
NS IRRIG 1000ML POUR BTL (IV SOLUTION) ×4 IMPLANT
PACK ARTHROSCOPY DSU (CUSTOM PROCEDURE TRAY) ×6 IMPLANT
PACK BASIN DAY SURGERY FS (CUSTOM PROCEDURE TRAY) ×4 IMPLANT
PAD CLEANER CAUTERY TIP 5X5 (MISCELLANEOUS) ×4
PADDING CAST ABS 4INX4YD NS (CAST SUPPLIES) ×2
PADDING CAST ABS COTTON 4X4 ST (CAST SUPPLIES) IMPLANT
PEG LOCKING 3.2MMX44 (Peg) ×2 IMPLANT
PEG LOCKING 3.2MMX46 (Peg) ×2 IMPLANT
PEG LOCKING 3.2X32 (Peg) ×2 IMPLANT
PEG LOCKING 3.2X36 (Screw) ×2 IMPLANT
PEG LOCKING 3.2X40 (Peg) ×2 IMPLANT
PEG LOCKING 3.2X42 (Screw) ×2 IMPLANT
PEG SUBCHONDRAL SMOOTH 2.0X18 (Peg) ×6 IMPLANT
PEG SUBCHONDRAL SMOOTH 2.0X20 (Peg) ×2 IMPLANT
PEG SUBCHONDRAL SMOOTH 2.0X24 (Peg) ×2 IMPLANT
PENCIL BUTTON HOLSTER BLD 10FT (ELECTRODE) ×4 IMPLANT
PLATE HUMERUS PROXIMAL 11H LT (Plate) ×2 IMPLANT
PLATE SHORT 21.6X48.9 NRRW LT (Plate) ×2 IMPLANT
RUBBERBAND STERILE (MISCELLANEOUS) IMPLANT
SCREW BN 12X3.5XNS CORT TI (Screw) IMPLANT
SCREW CORT 3.5X12 (Screw) ×8 IMPLANT
SCREW CORT 3.5X14 LNG (Screw) ×2 IMPLANT
SCREW CORT 3.5X16 LNG (Screw) ×2 IMPLANT
SCREW LOCK CORT STAR 3.5X22 (Screw) ×2 IMPLANT
SCREW LOCK CORT STAR 3.5X24 (Screw) ×4 IMPLANT
SCREW LOCK CORT STAR 3.5X26 (Screw) ×2 IMPLANT
SCREW LOW PROF TIS 3.5X28MM (Screw) ×2 IMPLANT
SCREW LP NL T15 3.5X26 (Screw) ×4 IMPLANT
SCREW MULTI DIRECT 18MM (Screw) ×2 IMPLANT
SCREW PEG LOCK 3.2X30MM (Screw) ×2 IMPLANT
SHEET MEDIUM DRAPE 40X70 STRL (DRAPES) ×2 IMPLANT
SLEEVE MEASURING 3.2 (BIT) ×2 IMPLANT
SLEEVE SCD COMPRESS KNEE MED (MISCELLANEOUS) ×4 IMPLANT
SLING ARM FOAM STRAP LRG (SOFTGOODS) IMPLANT
SLING ARM IMMOBILIZER MED (SOFTGOODS) IMPLANT
SLING ARM MED ADULT FOAM STRAP (SOFTGOODS) IMPLANT
SPLINT PLASTER CAST XFAST 3X15 (CAST SUPPLIES) IMPLANT
SPLINT PLASTER XTRA FASTSET 3X (CAST SUPPLIES) ×20
SPONGE LAP 18X18 X RAY DECT (DISPOSABLE) ×6 IMPLANT
SPONGE LAP 4X18 X RAY DECT (DISPOSABLE) IMPLANT
STOCKINETTE 6  STRL (DRAPES) ×2
STOCKINETTE 6 STRL (DRAPES) ×2 IMPLANT
STRIP CLOSURE SKIN 1/2X4 (GAUZE/BANDAGES/DRESSINGS) IMPLANT
SUCTION FRAZIER HANDLE 10FR (MISCELLANEOUS) ×2
SUCTION TUBE FRAZIER 10FR DISP (MISCELLANEOUS) ×2 IMPLANT
SUT FIBERWIRE #2 38 T-5 BLUE (SUTURE)
SUT VIC AB 0 CT1 18XCR BRD 8 (SUTURE) IMPLANT
SUT VIC AB 0 CT1 8-18 (SUTURE)
SUT VIC AB 0 SH 27 (SUTURE) IMPLANT
SUT VIC AB 2-0 CT3 27 (SUTURE) IMPLANT
SUT VIC AB 2-0 PS2 27 (SUTURE) ×4 IMPLANT
SUT VIC AB 2-0 SH 18 (SUTURE) IMPLANT
SUT VIC AB 2-0 SH 27 (SUTURE)
SUT VIC AB 2-0 SH 27XBRD (SUTURE) IMPLANT
SUT VICRYL 4-0 PS2 18IN ABS (SUTURE) IMPLANT
SUT VICRYL RAPIDE 4-0 (SUTURE) ×2 IMPLANT
SUT VICRYL RAPIDE 4/0 PS 2 (SUTURE) ×4 IMPLANT
SUTURE FIBERWR #2 38 T-5 BLUE (SUTURE) IMPLANT
SYR BULB 3OZ (MISCELLANEOUS) ×4 IMPLANT
SYRINGE 10CC LL (SYRINGE) IMPLANT
TOWEL OR 17X24 6PK STRL BLUE (TOWEL DISPOSABLE) ×4 IMPLANT
TOWEL OR NON WOVEN STRL DISP B (DISPOSABLE) ×6 IMPLANT
TUBE CONNECTING 20'X1/4 (TUBING) ×1
TUBE CONNECTING 20X1/4 (TUBING) ×3 IMPLANT
UNDERPAD 30X30 (UNDERPADS AND DIAPERS) ×4 IMPLANT
YANKAUER SUCT BULB TIP NO VENT (SUCTIONS) ×4 IMPLANT

## 2015-09-17 NOTE — Interval H&P Note (Signed)
History and Physical Interval Note:  09/17/2015 8:07 AM  Theresa Booth  has presented today for surgery, with the diagnosis of LEFT PROXIMAL HUMERUS AND DISTAL RADIUS FRACTURES  The various methods of treatment have been discussed with the patient and family. After consideration of risks, benefits and other options for treatment, the patient has consented to  Procedure(s): OPEN TREATMENT OF LEFT PROXIMAL HUMERUS AND DISTAL RADIAL FRACTURES (Left) OPEN REDUCTION INTERNAL FIXATION (ORIF) PROXIMAL HUMERUS FRACTURE (Left) as a surgical intervention .  The patient's history has been reviewed, patient examined, no change in status, stable for surgery.  I have reviewed the patient's chart and labs.  Questions were answered to the patient's satisfaction.     Zi Sek A.

## 2015-09-17 NOTE — Anesthesia Preprocedure Evaluation (Addendum)
Anesthesia Evaluation  Patient identified by MRN, date of birth, ID band Patient awake    Reviewed: Allergy & Precautions, NPO status , Patient's Chart, lab work & pertinent test results  Airway Mallampati: II  TM Distance: >3 FB Neck ROM: Full    Dental   Pulmonary asthma ,    breath sounds clear to auscultation       Cardiovascular hypertension, Pt. on medications  Rhythm:Regular Rate:Normal     Neuro/Psych negative neurological ROS     GI/Hepatic Neg liver ROS, GERD  Medicated,  Endo/Other  negative endocrine ROS  Renal/GU negative Renal ROS     Musculoskeletal   Abdominal   Peds  Hematology negative hematology ROS (+)   Anesthesia Other Findings   Reproductive/Obstetrics                            Anesthesia Physical Anesthesia Plan  ASA: II  Anesthesia Plan: General and Regional   Post-op Pain Management: GA combined w/ Regional for post-op pain   Induction: Intravenous  Airway Management Planned: Oral ETT  Additional Equipment:   Intra-op Plan:   Post-operative Plan: Extubation in OR  Informed Consent: I have reviewed the patients History and Physical, chart, labs and discussed the procedure including the risks, benefits and alternatives for the proposed anesthesia with the patient or authorized representative who has indicated his/her understanding and acceptance.   Dental advisory given  Plan Discussed with: CRNA  Anesthesia Plan Comments:         Anesthesia Quick Evaluation

## 2015-09-17 NOTE — Transfer of Care (Signed)
Immediate Anesthesia Transfer of Care Note  Patient: Theresa Booth  Procedure(s) Performed: Procedure(s): OPEN REDUCTION INTERNAL FIXATION (ORIF) PROXIMAL HUMERUS FRACTURE (Left) OPEN REDUCTION INTERNAL FIXATION (ORIF) DISTAL RADIAL FRACTURE (Left)  Patient Location: PACU  Anesthesia Type:GA combined with regional for post-op pain  Level of Consciousness: awake, alert , oriented and patient cooperative  Airway & Oxygen Therapy: Patient Spontanous Breathing and Patient connected to face mask oxygen  Post-op Assessment: Report given to RN and Post -op Vital signs reviewed and stable  Post vital signs: Reviewed and stable  Last Vitals:  Filed Vitals:   09/17/15 0915 09/17/15 0920  BP: 133/68   Pulse: 119 119  Temp:    Resp: 18 18    Last Pain:  Filed Vitals:   09/17/15 0922  PainSc: 3       Patients Stated Pain Goal: 2 ([id number])  Complications: No apparent anesthesia complications

## 2015-09-17 NOTE — Progress Notes (Signed)
Assisted Dr. Rob Fitzgerald with left, ultrasound guided, interscalene  block. Side rails up, monitors on throughout procedure. See vital signs in flow sheet. Tolerated Procedure well. 

## 2015-09-17 NOTE — Op Note (Signed)
09/17/2015  8:08 AM  PATIENT:  Theresa Booth  65 y.o. female  PRE-OPERATIVE DIAGNOSIS:  Displaced left proximal humerus fracture and intra-articular distal radius fractures  POST-OPERATIVE DIAGNOSIS:  Same  PROCEDURE:  ORIF displaced left proximal humerus fracture and intra-articular distal radius fractures  SURGEON: Rayvon Char. Grandville Silos, MD  PHYSICIAN ASSISTANT: Morley Kos, OPA-C  ANESTHESIA:  regional and general  SPECIMENS:  None  DRAINS: None  EBL:  300 mL  PREOPERATIVE INDICATIONS:  [name] is a  65 y.o. female with displaced fractures of the left proximal humerus and intra-articular distal radius.  The risks benefits and alternatives were discussed with the patient preoperatively including but not limited to the risks of infection, bleeding, nerve injury, cardiopulmonary complications, the need for revision surgery, among others, and the patient verbalized understanding and consented to proceed.  OPERATIVE IMPLANTS: Biomet proximal humerus plate/screws/pegs and DVR plate/screws/pegs  OPERATIVE PROCEDURE: After receiving prophylactic antibiotics and a regional block, the patient was escorted to the operative theatre and placed in a supine position.  General anesthesia was administered.  A surgical "time-out" was performed during which the planned procedure, proposed operative site, and the correct patient identity were compared to the operative consent and agreement confirmed by the circulating nurse according to current facility policy.  She was repositioned in a beachchair position.  The exposed skin was pre-scrubbed with Hibiclens scrub brush and then was prepped with Chloraprep and draped in the usual sterile fashion.  The shoulder was addressed first.  The arm was held in a Scientist, forensic.  A deltopectoral approach was made sharply through the skin with a scalpel, deeply with a combination of blunt and spreading dissection.  The upper one third of the  pectoralis major tendon was also taken down to assist with exposure and reduction.  The fracture was identified, cleaned of clot and debris, and provisionally reduced.  The Biomet plate was selected and applied to the bone, lying just lateral to the biceps tendon proximally.  The K wire was driven to the center hole found to be acceptable in its position.  The fracture with further reduced and clamped to each other incorporating the plate and the plate was secured to the shaft through the 2 separate slotted holes.  Long enough plate was selected that it had the pre-curve to leave the deltoid insertion undisturbed.  The proximal holes were filled with pegs, drilling the outer portions on power and then on hand advancing to the subchondral bone.  Once the proximal smooth pegs were placed, the remainder of the holes in the plate were placed and they were locking.  Final images of the shoulder were obtained, revealing acceptable alignment of the fracture, good restoration of stability, and that the hardware remained extra-articular.  The wounds were copiously irrigated, the pectoralis tendon repaired with 0 Vicryl figure-of-eight sutures.  The deltopectoral interval was reapproximated with 2-0 Vicryl suture followed by closure of the skin with interrupted 2-0 Vicryl deep dermal buried sutures and staples in the skin.  Attention was then shifted to the distal radius.  The beachchair position was laid flat, the table brought back to flat, and arm board applied, and a new extremity drape placed over the limb.  A sterile tourniquet was applied but not inflated. The limb was exsanguinated with an Esmarch bandage and the tourniquet inflated to approximately 130mmHg higher than systolic BP.   A sinusoidal-shaped incision was marked and made over the FCR axis and the distal forearm. The skin was  incised sharply with scalpel, subcutaneous tissues with blunt and spreading dissection. The FCR axis was exploited deeply. The  pronator quadratus was reflected in an L-shaped ulnarly and the brachioradialis was split in a Z-plasty fashion for later reapproximation. The fracture was inspected and provisionally reduced.  This was confirmed fluoroscopically. The appropriately sized plate was selected and found to fit well. It was placed in its provisional alignment of the radius and this was confirmed fluoroscopically.  It was secured to the radius with a screw through the slotted hole.  Additional adjustments were made as necessary, and the distal holes were all drilled and filled.  Peg/screw length distally was selected on the shorter side of measurements to minimize the risk for dorsal cortical penetration. The remainder of the proximal holes were drilled and filled.   Final images were obtained and the DRUJ was examined for stability. It was found to be sufficiently stable. The wound was then copiously irrigated and the brachioradialis repaired with 2-0 Vicryl Rapide suture followed by repair of the pronator quadratus with the same suture type. Tourniquet was released and additional hemostasis obtained and the skin was closed with 2-0 Vicryl deep dermal buried sutures followed by running 4-0 Vicryl Rapide horizontal mattress suture in the skin. A bulky dressing with a volar plaster component was applied to the forearm, soft dressing over the brachium, and she was taken to room stable condition, breathing spontaneously.  DISPOSITION: The patient will be discharged home today with typical post-op instructions, returning in 10-15 days for reevaluation with new x-rays of the affected wrist out of the splint to include an inclined lateral, x-rays of the left humerus, and then transition to therapy to have a custom splint constructed and begin rehabilitation.

## 2015-09-17 NOTE — Discharge Instructions (Signed)

## 2015-09-17 NOTE — Anesthesia Postprocedure Evaluation (Signed)
Anesthesia Post Note  Patient: Nicha Zora Dommer  Procedure(s) Performed: Procedure(s) (LRB): OPEN REDUCTION INTERNAL FIXATION (ORIF) PROXIMAL HUMERUS FRACTURE (Left) OPEN REDUCTION INTERNAL FIXATION (ORIF) DISTAL RADIAL FRACTURE (Left)  Patient location during evaluation: PACU Anesthesia Type: General and Regional Level of consciousness: awake and alert Pain management: pain level controlled Vital Signs Assessment: post-procedure vital signs reviewed and stable Respiratory status: spontaneous breathing, nonlabored ventilation and respiratory function stable Cardiovascular status: blood pressure returned to baseline and stable Postop Assessment: no signs of nausea or vomiting Anesthetic complications: no    Last Vitals:  Filed Vitals:   09/17/15 1355 09/17/15 1415  BP:  129/77  Pulse: 132 117  Temp: 36.6 C   Resp: 16 20    Last Pain:  Filed Vitals:   09/17/15 1422  PainSc: 0-No pain                 Deva Ron,W. EDMOND

## 2015-09-17 NOTE — Anesthesia Procedure Notes (Addendum)
Anesthesia Regional Block:  Supraclavicular block  Pre-Anesthetic Checklist: ,, timeout performed, Correct Patient, Correct Site, Correct Laterality, Correct Procedure, Correct Position, site marked, Risks and benefits discussed,  Surgical consent,  Pre-op evaluation,  At surgeon's request and post-op pain management  Laterality: Left  Prep: chloraprep       Needles:  Injection technique: Single-shot  Needle Type: Echogenic Stimulator Needle     Needle Length: 9cm 9 cm Needle Gauge: 21 and 21 G    Additional Needles:  Procedures: ultrasound guided (picture in chart) and nerve stimulator Supraclavicular block Narrative:  Injection made incrementally with aspirations every 5 mL.  Performed by: Personally  Anesthesiologist: Suzette Battiest  Additional Notes: Risks, benefits and alternative to block explained extensively.  Patient tolerated procedure well, without complications.   Procedure Name: Intubation Date/Time: 09/17/2015 9:58 AM Performed by: Lakelyn Straus D Pre-anesthesia Checklist: Patient identified, Emergency Drugs available, Suction available and Patient being monitored Patient Re-evaluated:Patient Re-evaluated prior to inductionOxygen Delivery Method: Circle System Utilized Preoxygenation: Pre-oxygenation with 100% oxygen Intubation Type: IV induction Ventilation: Mask ventilation without difficulty Laryngoscope Size: Mac and 2 Grade View: Grade II Tube type: Oral Tube size: 7.0 mm Number of attempts: 1 Airway Equipment and Method: Stylet,  Oral airway and Bougie stylet Placement Confirmation: ETT inserted through vocal cords under direct vision,  positive ETCO2 and breath sounds checked- equal and bilateral Secured at: 21 cm Tube secured with: Tape Dental Injury: Teeth and Oropharynx as per pre-operative assessment  Comments: DL x 1 able to visualize partial vocal cords, anterior, limited oral cavity passed bougie stylet without difficulty, 7.0 ETT  placed through atraumatic oral intubation =BBS, + ETCO2, dentition unchanged

## 2015-09-17 NOTE — H&P (View-Only) (Signed)
ORTHOPAEDIC CONSULTATION HISTORY & PHYSICAL REQUESTING PHYSICIAN: Elnora Morrison, MD  Chief Complaint: left arm injury  HPI: Theresa Booth is a 65 y.o. female who fell off a chair earlier, landing upon her left UE.  Denies LOC, dizziness, etc. She was evaluated at [facility], [place] obtained, and she was transferred to the ED for further evaluation.  She c/o left shoulder and wrist pain, with bruising and deformity.  The on-call orthopedic surgeon has apparently already been consulted regarding her L proximal humerus fracture  History reviewed. No pertinent past medical history. Past Surgical History  Procedure Laterality Date  . Breast surgery     Social History   Social History  . Marital Status: Single    Spouse Name: N/A  . Number of Children: N/A  . Years of Education: N/A   Social History Main Topics  . Smoking status: Never Smoker   . Smokeless tobacco: None  . Alcohol Use: No  . Drug Use: No  . Sexual Activity: Not Asked   Other Topics Concern  . None   Social History Narrative   History reviewed. No pertinent family history. No Known Allergies Prior to Admission medications   Medication Sig Start Date End Date Taking? Authorizing Provider  ADVAIR DISKUS 100-50 MCG/DOSE AEPB Inhale 1 puff into the lungs daily.  08/13/15  Yes Historical Provider, MD  amLODipine (NORVASC) 5 MG tablet Take 5 mg by mouth daily. 08/06/15  Yes Historical Provider, MD  Calcium Carbonate-Vitamin D (CALCIUM-VITAMIN D) 500-200 MG-UNIT tablet Take 1 tablet by mouth 2 (two) times daily.   Yes Historical Provider, MD  fluticasone (FLONASE) 50 MCG/ACT nasal spray Place 2 sprays into both nostrils daily.   Yes Historical Provider, MD  ibuprofen (ADVIL,MOTRIN) 200 MG tablet Take 400 mg by mouth daily.   Yes Historical Provider, MD  Multiple Vitamin (MULTIVITAMIN) tablet Take 1 tablet by mouth daily.   Yes Historical Provider, MD  vitamin E 400 UNIT capsule Take 400 Units by mouth daily.   Yes  Historical Provider, MD  oxyCODONE-acetaminophen (ROXICET) 5-325 MG tablet Take 1-2 tablets by mouth every 4 (four) hours as needed. 09/08/15   [name], MD   Dg Chest 1 View  09/08/2015  CLINICAL DATA:  [place] on outstretched hand EXAM: CHEST 1 VIEW COMPARISON:  CT chest 10/05/2013 FINDINGS: Heart size and vascularity normal. Probable COPD. Negative for pneumonia. No infiltrate effusion or mass lesion. Lungs are clear. IMPRESSION: No active disease. Electronically Signed   By: Franchot Gallo M.D.   On: 09/08/2015 20:10   Dg Elbow 2 Views Left  09/08/2015  CLINICAL DATA:  [place] on outstretched hand. EXAM: LEFT ELBOW - 2 VIEW COMPARISON:  None. FINDINGS: There is no evidence of fracture, dislocation, or joint effusion. There is no evidence of arthropathy or other focal bone abnormality. Soft tissues are unremarkable. IMPRESSION: Negative. Electronically Signed   By: Franchot Gallo M.D.   On: 09/08/2015 20:12   Dg Forearm Left  09/08/2015  CLINICAL DATA:  [place] on outstretched hand EXAM: LEFT FOREARM - 2 VIEW COMPARISON:  None. FINDINGS: Fracture of the distal radius with dorsal displacement. Fracture of the ulnar styloid. No other fracture of the radius or ulna. IMPRESSION: Displaced fracture distal radius. Mildly displaced fracture ulnar styloid. Electronically Signed   By: Franchot Gallo M.D.   On: 09/08/2015 20:14   Dg Wrist 2 Views Left  09/08/2015  CLINICAL DATA:  [place] on outstretched hand EXAM: LEFT WRIST - 2 VIEW COMPARISON:  None. FINDINGS: Transverse  fracture distal radius with dorsal displacement of nearly 1 shaft width. Ulnar styloid fracture. Degenerative change at the base of thumb. IMPRESSION: Dorsally displaced fracture distal radius. Ulnar styloid also fractured. Electronically Signed   By: Franchot Gallo M.D.   On: 09/08/2015 20:14   Dg Hand 2 View Left  09/08/2015  CLINICAL DATA:  [place] on outstretched hand EXAM: LEFT HAND - 2 VIEW COMPARISON:  None. FINDINGS: Transverse fracture  distal radius with dorsal displacement. Fracture of the ulnar styloid Degenerative change moderate to advanced at base of thumb. Limited imaging of the hand due to positioning difficulty. No other fracture. IMPRESSION: Fracture distal radius and ulna. Electronically Signed   By: Franchot Gallo M.D.   On: 09/08/2015 20:15   Dg Shoulder Left  09/08/2015  CLINICAL DATA:  [place] on outstretched hand EXAM: LEFT SHOULDER - 2+ VIEW COMPARISON:  None. FINDINGS: Fracture of the proximal humeral diaphysis just below the humeral neck. 1/2 shaft width of medial displacement. Glenohumeral joint normal alignment. AC joint normal. IMPRESSION: Displaced fracture proximal left humeral shaft. Electronically Signed   By: Franchot Gallo M.D.   On: 09/08/2015 20:11   Dg Humerus Left  09/08/2015  CLINICAL DATA:  [place] on outstretched hand EXAM: LEFT HUMERUS - 2+ VIEW COMPARISON:  None. FINDINGS: Fracture of the proximal humeral shaft just below the humeral neck. Medial displacement. No other fracture the humerus. Normal shoulder joint IMPRESSION: Displaced fracture proximal humerus. Electronically Signed   By: Franchot Gallo M.D.   On: 09/08/2015 20:12   Dg Humerus Left  09/08/2015  CLINICAL DATA:  Recent fall from chair with left shoulder pain, initial encounter EXAM: LEFT HUMERUS - 2+ VIEW COMPARISON:  None. FINDINGS: There is an oblique fracture through the proximal humeral shaft just below the surgical neck. Some mild impaction is noted at the fracture site. The more distal humerus is within normal limits. IMPRESSION: Oblique proximal humeral fracture. Electronically Signed   By: [name] M.D.   On: 09/08/2015 18:00    Positive ROS: All other systems have been reviewed and were otherwise negative with the exception of those mentioned in the HPI and as above.  Physical Exam: Vitals: Refer to EMR. Constitutional:  WD, WN, NAD HEENT:  NCAT, EOMI Neuro/Psych:  Alert & oriented to person, place, and time; appropriate  mood & affect Lymphatic: No generalized extremity edema or lymphadenopathy Extremities / MSK:  The extremities are normal with respect to appearance, ranges of motion, joint stability, muscle strength/tone, sensation, & perfusion except as otherwise noted:  Left wrist with obviously dorsally and radially displaced distal radius fracture.  Intact LT sens R/M/U nerve distributions, with intact motor to same.  Radial pulse palpable, fingers warm, CR brisk  Assessment: 1. Comminuted, displaced very distal left distal radius fracture 2. Displaced left proximal humerus neck fx  Plan: I discussed these issues with her, and recommended closed reduction of the distal radius fracture in the emergency department with hematoma block, hopefully obtaining improvement sufficient for initial splint treatment.  She consented.  Hematoma block instilled with 10 mL's of 1% plain lidocaine by me.  After sufficient degree of anesthesia had been obtained, gentle manipulative closed reduction was performed and this was thought to be satisfactory for continued closed treatment at this point.  Sugar tong splint was applied and final images obtained.  Postreduction x-rays revealed significant improvement in alignment, still with some residual dorsal translational displacement, but with much better correction in the coronal plane and minimal if any dorsal  tilt.  Postreduction neurovascular exam unchanged.    Her distal radius fracture is sufficiently improved for her to be discharged from the emergency department with a sugar tong splint and perhaps the addition of a coaptation splint for her left proximal humerus fracture.  I will need to confer with the orthopedic surgeon who has been consulted regarding her left proximal humerus fracture to discuss a coordinated care plan for her left upper extremity.  I have prepared a Rx for Percocet for discharge.  My office will call her Monday regarding her next appointment with  me.  ADDENDUM: Spoke with Dr. Lyla Glassing, who agreed the consolidation of care was probably in this patient's best interest.  I will plan to see him care for her proximal humerus as well.  We will plan to apply a coaptation splint in the ER, and then likely address both surgically later this week.  Rayvon Char [address], [address] [place], [place]  [address] Office: [phone] Mobile: [phone]  09/08/2015, 9:27 PM

## 2015-09-18 ENCOUNTER — Encounter (HOSPITAL_BASED_OUTPATIENT_CLINIC_OR_DEPARTMENT_OTHER): Payer: Self-pay | Admitting: Orthopedic Surgery

## 2015-10-03 DIAGNOSIS — S52572D Other intraarticular fracture of lower end of left radius, subsequent encounter for closed fracture with routine healing: Secondary | ICD-10-CM | POA: Diagnosis not present

## 2015-10-03 DIAGNOSIS — S42292D Other displaced fracture of upper end of left humerus, subsequent encounter for fracture with routine healing: Secondary | ICD-10-CM | POA: Diagnosis not present

## 2015-10-08 DIAGNOSIS — S52572D Other intraarticular fracture of lower end of left radius, subsequent encounter for closed fracture with routine healing: Secondary | ICD-10-CM | POA: Diagnosis not present

## 2015-10-10 DIAGNOSIS — S52572D Other intraarticular fracture of lower end of left radius, subsequent encounter for closed fracture with routine healing: Secondary | ICD-10-CM | POA: Diagnosis not present

## 2015-10-11 DIAGNOSIS — S52572D Other intraarticular fracture of lower end of left radius, subsequent encounter for closed fracture with routine healing: Secondary | ICD-10-CM | POA: Diagnosis not present

## 2015-10-18 DIAGNOSIS — S52572D Other intraarticular fracture of lower end of left radius, subsequent encounter for closed fracture with routine healing: Secondary | ICD-10-CM | POA: Diagnosis not present

## 2015-10-22 ENCOUNTER — Ambulatory Visit: Payer: BLUE CROSS/BLUE SHIELD

## 2015-10-22 DIAGNOSIS — Z0000 Encounter for general adult medical examination without abnormal findings: Secondary | ICD-10-CM | POA: Diagnosis not present

## 2015-10-22 DIAGNOSIS — Z800 Family history of malignant neoplasm of digestive organs: Secondary | ICD-10-CM | POA: Diagnosis not present

## 2015-10-22 DIAGNOSIS — J4520 Mild intermittent asthma, uncomplicated: Secondary | ICD-10-CM | POA: Diagnosis not present

## 2015-10-22 DIAGNOSIS — I10 Essential (primary) hypertension: Secondary | ICD-10-CM | POA: Diagnosis not present

## 2015-10-22 DIAGNOSIS — Z853 Personal history of malignant neoplasm of breast: Secondary | ICD-10-CM | POA: Diagnosis not present

## 2015-10-22 DIAGNOSIS — M8588 Other specified disorders of bone density and structure, other site: Secondary | ICD-10-CM | POA: Diagnosis not present

## 2015-10-22 DIAGNOSIS — M8000XA Age-related osteoporosis with current pathological fracture, unspecified site, initial encounter for fracture: Secondary | ICD-10-CM | POA: Diagnosis not present

## 2015-10-22 DIAGNOSIS — J301 Allergic rhinitis due to pollen: Secondary | ICD-10-CM | POA: Diagnosis not present

## 2015-10-22 DIAGNOSIS — N951 Menopausal and female climacteric states: Secondary | ICD-10-CM | POA: Diagnosis not present

## 2015-10-26 DIAGNOSIS — S52572D Other intraarticular fracture of lower end of left radius, subsequent encounter for closed fracture with routine healing: Secondary | ICD-10-CM | POA: Diagnosis not present

## 2015-10-31 DIAGNOSIS — S52572D Other intraarticular fracture of lower end of left radius, subsequent encounter for closed fracture with routine healing: Secondary | ICD-10-CM | POA: Diagnosis not present

## 2015-10-31 DIAGNOSIS — S42292D Other displaced fracture of upper end of left humerus, subsequent encounter for fracture with routine healing: Secondary | ICD-10-CM | POA: Diagnosis not present

## 2015-11-01 DIAGNOSIS — S52572D Other intraarticular fracture of lower end of left radius, subsequent encounter for closed fracture with routine healing: Secondary | ICD-10-CM | POA: Diagnosis not present

## 2015-11-02 DIAGNOSIS — S52572D Other intraarticular fracture of lower end of left radius, subsequent encounter for closed fracture with routine healing: Secondary | ICD-10-CM | POA: Diagnosis not present

## 2015-11-05 DIAGNOSIS — S52572D Other intraarticular fracture of lower end of left radius, subsequent encounter for closed fracture with routine healing: Secondary | ICD-10-CM | POA: Diagnosis not present

## 2015-11-07 ENCOUNTER — Ambulatory Visit: Payer: BLUE CROSS/BLUE SHIELD

## 2015-11-08 DIAGNOSIS — S52572D Other intraarticular fracture of lower end of left radius, subsequent encounter for closed fracture with routine healing: Secondary | ICD-10-CM | POA: Diagnosis not present

## 2015-11-14 DIAGNOSIS — S52572D Other intraarticular fracture of lower end of left radius, subsequent encounter for closed fracture with routine healing: Secondary | ICD-10-CM | POA: Diagnosis not present

## 2015-11-16 DIAGNOSIS — S52572D Other intraarticular fracture of lower end of left radius, subsequent encounter for closed fracture with routine healing: Secondary | ICD-10-CM | POA: Diagnosis not present

## 2015-11-21 DIAGNOSIS — S52572D Other intraarticular fracture of lower end of left radius, subsequent encounter for closed fracture with routine healing: Secondary | ICD-10-CM | POA: Diagnosis not present

## 2015-11-23 DIAGNOSIS — S52572D Other intraarticular fracture of lower end of left radius, subsequent encounter for closed fracture with routine healing: Secondary | ICD-10-CM | POA: Diagnosis not present

## 2015-11-27 DIAGNOSIS — S52572D Other intraarticular fracture of lower end of left radius, subsequent encounter for closed fracture with routine healing: Secondary | ICD-10-CM | POA: Diagnosis not present

## 2015-11-29 DIAGNOSIS — S52572D Other intraarticular fracture of lower end of left radius, subsequent encounter for closed fracture with routine healing: Secondary | ICD-10-CM | POA: Diagnosis not present

## 2015-12-05 DIAGNOSIS — S52572D Other intraarticular fracture of lower end of left radius, subsequent encounter for closed fracture with routine healing: Secondary | ICD-10-CM | POA: Diagnosis not present

## 2015-12-07 DIAGNOSIS — S52572D Other intraarticular fracture of lower end of left radius, subsequent encounter for closed fracture with routine healing: Secondary | ICD-10-CM | POA: Diagnosis not present

## 2015-12-11 DIAGNOSIS — S52572D Other intraarticular fracture of lower end of left radius, subsequent encounter for closed fracture with routine healing: Secondary | ICD-10-CM | POA: Diagnosis not present

## 2015-12-13 DIAGNOSIS — S52572D Other intraarticular fracture of lower end of left radius, subsequent encounter for closed fracture with routine healing: Secondary | ICD-10-CM | POA: Diagnosis not present

## 2015-12-13 DIAGNOSIS — S42292D Other displaced fracture of upper end of left humerus, subsequent encounter for fracture with routine healing: Secondary | ICD-10-CM | POA: Diagnosis not present

## 2015-12-14 DIAGNOSIS — S52572D Other intraarticular fracture of lower end of left radius, subsequent encounter for closed fracture with routine healing: Secondary | ICD-10-CM | POA: Diagnosis not present

## 2015-12-18 DIAGNOSIS — S52572D Other intraarticular fracture of lower end of left radius, subsequent encounter for closed fracture with routine healing: Secondary | ICD-10-CM | POA: Diagnosis not present

## 2015-12-20 DIAGNOSIS — S52572D Other intraarticular fracture of lower end of left radius, subsequent encounter for closed fracture with routine healing: Secondary | ICD-10-CM | POA: Diagnosis not present

## 2015-12-21 ENCOUNTER — Other Ambulatory Visit: Payer: Self-pay | Admitting: Family Medicine

## 2015-12-21 DIAGNOSIS — Z1231 Encounter for screening mammogram for malignant neoplasm of breast: Secondary | ICD-10-CM

## 2015-12-21 DIAGNOSIS — Z853 Personal history of malignant neoplasm of breast: Secondary | ICD-10-CM

## 2015-12-21 DIAGNOSIS — Z9889 Other specified postprocedural states: Secondary | ICD-10-CM

## 2015-12-24 DIAGNOSIS — S52572D Other intraarticular fracture of lower end of left radius, subsequent encounter for closed fracture with routine healing: Secondary | ICD-10-CM | POA: Diagnosis not present

## 2015-12-26 ENCOUNTER — Other Ambulatory Visit: Payer: Self-pay | Admitting: Gastroenterology

## 2015-12-26 DIAGNOSIS — Z1211 Encounter for screening for malignant neoplasm of colon: Secondary | ICD-10-CM | POA: Diagnosis not present

## 2015-12-26 DIAGNOSIS — K640 First degree hemorrhoids: Secondary | ICD-10-CM | POA: Diagnosis not present

## 2015-12-26 DIAGNOSIS — D124 Benign neoplasm of descending colon: Secondary | ICD-10-CM | POA: Diagnosis not present

## 2015-12-26 DIAGNOSIS — D123 Benign neoplasm of transverse colon: Secondary | ICD-10-CM | POA: Diagnosis not present

## 2015-12-26 DIAGNOSIS — K635 Polyp of colon: Secondary | ICD-10-CM | POA: Diagnosis not present

## 2015-12-26 DIAGNOSIS — Z800 Family history of malignant neoplasm of digestive organs: Secondary | ICD-10-CM | POA: Diagnosis not present

## 2015-12-28 DIAGNOSIS — S52572D Other intraarticular fracture of lower end of left radius, subsequent encounter for closed fracture with routine healing: Secondary | ICD-10-CM | POA: Diagnosis not present

## 2016-01-02 DIAGNOSIS — S52572D Other intraarticular fracture of lower end of left radius, subsequent encounter for closed fracture with routine healing: Secondary | ICD-10-CM | POA: Diagnosis not present

## 2016-01-03 ENCOUNTER — Ambulatory Visit
Admission: RE | Admit: 2016-01-03 | Discharge: 2016-01-03 | Disposition: A | Discharge status: Home / Self Care | Payer: PPO | Source: Ambulatory Visit | Attending: Family Medicine | Admitting: Family Medicine

## 2016-01-03 DIAGNOSIS — Z1231 Encounter for screening mammogram for malignant neoplasm of breast: Secondary | ICD-10-CM

## 2016-01-03 DIAGNOSIS — Z853 Personal history of malignant neoplasm of breast: Secondary | ICD-10-CM

## 2016-01-03 DIAGNOSIS — Z9889 Other specified postprocedural states: Secondary | ICD-10-CM

## 2016-01-04 DIAGNOSIS — S52572D Other intraarticular fracture of lower end of left radius, subsequent encounter for closed fracture with routine healing: Secondary | ICD-10-CM | POA: Diagnosis not present

## 2016-01-09 DIAGNOSIS — S52572D Other intraarticular fracture of lower end of left radius, subsequent encounter for closed fracture with routine healing: Secondary | ICD-10-CM | POA: Diagnosis not present

## 2016-01-10 DIAGNOSIS — S52572D Other intraarticular fracture of lower end of left radius, subsequent encounter for closed fracture with routine healing: Secondary | ICD-10-CM | POA: Diagnosis not present

## 2016-01-11 DIAGNOSIS — Z818 Family history of other mental and behavioral disorders: Secondary | ICD-10-CM | POA: Diagnosis not present

## 2016-01-11 DIAGNOSIS — S42309A Unspecified fracture of shaft of humerus, unspecified arm, initial encounter for closed fracture: Secondary | ICD-10-CM | POA: Diagnosis not present

## 2016-01-11 DIAGNOSIS — I10 Essential (primary) hypertension: Secondary | ICD-10-CM | POA: Diagnosis not present

## 2016-01-11 DIAGNOSIS — Z23 Encounter for immunization: Secondary | ICD-10-CM | POA: Diagnosis not present

## 2016-01-15 DIAGNOSIS — S52572D Other intraarticular fracture of lower end of left radius, subsequent encounter for closed fracture with routine healing: Secondary | ICD-10-CM | POA: Diagnosis not present

## 2016-01-17 DIAGNOSIS — S52572D Other intraarticular fracture of lower end of left radius, subsequent encounter for closed fracture with routine healing: Secondary | ICD-10-CM | POA: Diagnosis not present

## 2016-01-22 DIAGNOSIS — S52572D Other intraarticular fracture of lower end of left radius, subsequent encounter for closed fracture with routine healing: Secondary | ICD-10-CM | POA: Diagnosis not present

## 2016-01-24 DIAGNOSIS — S52572D Other intraarticular fracture of lower end of left radius, subsequent encounter for closed fracture with routine healing: Secondary | ICD-10-CM | POA: Diagnosis not present

## 2016-01-24 DIAGNOSIS — S42292D Other displaced fracture of upper end of left humerus, subsequent encounter for fracture with routine healing: Secondary | ICD-10-CM | POA: Diagnosis not present

## 2016-01-28 DIAGNOSIS — S52572D Other intraarticular fracture of lower end of left radius, subsequent encounter for closed fracture with routine healing: Secondary | ICD-10-CM | POA: Diagnosis not present

## 2016-01-31 DIAGNOSIS — S52572D Other intraarticular fracture of lower end of left radius, subsequent encounter for closed fracture with routine healing: Secondary | ICD-10-CM | POA: Diagnosis not present

## 2016-02-04 DIAGNOSIS — S52572D Other intraarticular fracture of lower end of left radius, subsequent encounter for closed fracture with routine healing: Secondary | ICD-10-CM | POA: Diagnosis not present

## 2016-02-14 DIAGNOSIS — M8588 Other specified disorders of bone density and structure, other site: Secondary | ICD-10-CM | POA: Diagnosis not present

## 2016-02-25 DIAGNOSIS — S52572D Other intraarticular fracture of lower end of left radius, subsequent encounter for closed fracture with routine healing: Secondary | ICD-10-CM | POA: Diagnosis not present

## 2016-03-11 DIAGNOSIS — S52572D Other intraarticular fracture of lower end of left radius, subsequent encounter for closed fracture with routine healing: Secondary | ICD-10-CM | POA: Diagnosis not present

## 2016-04-17 ENCOUNTER — Other Ambulatory Visit: Payer: Self-pay | Admitting: Family Medicine

## 2016-04-17 DIAGNOSIS — Z1231 Encounter for screening mammogram for malignant neoplasm of breast: Secondary | ICD-10-CM

## 2016-04-30 DIAGNOSIS — J4520 Mild intermittent asthma, uncomplicated: Secondary | ICD-10-CM | POA: Diagnosis not present

## 2016-04-30 DIAGNOSIS — Z23 Encounter for immunization: Secondary | ICD-10-CM | POA: Diagnosis not present

## 2016-04-30 DIAGNOSIS — I10 Essential (primary) hypertension: Secondary | ICD-10-CM | POA: Diagnosis not present

## 2016-05-08 DIAGNOSIS — H40033 Anatomical narrow angle, bilateral: Secondary | ICD-10-CM | POA: Diagnosis not present

## 2016-05-08 DIAGNOSIS — H2513 Age-related nuclear cataract, bilateral: Secondary | ICD-10-CM | POA: Diagnosis not present

## 2016-11-24 DIAGNOSIS — Z1389 Encounter for screening for other disorder: Secondary | ICD-10-CM | POA: Diagnosis not present

## 2016-11-24 DIAGNOSIS — Z853 Personal history of malignant neoplasm of breast: Secondary | ICD-10-CM | POA: Diagnosis not present

## 2016-11-24 DIAGNOSIS — I10 Essential (primary) hypertension: Secondary | ICD-10-CM | POA: Diagnosis not present

## 2016-11-24 DIAGNOSIS — E559 Vitamin D deficiency, unspecified: Secondary | ICD-10-CM | POA: Diagnosis not present

## 2016-11-24 DIAGNOSIS — J301 Allergic rhinitis due to pollen: Secondary | ICD-10-CM | POA: Diagnosis not present

## 2016-11-24 DIAGNOSIS — M80021A Age-related osteoporosis with current pathological fracture, right humerus, initial encounter for fracture: Secondary | ICD-10-CM | POA: Diagnosis not present

## 2016-11-24 DIAGNOSIS — Z0000 Encounter for general adult medical examination without abnormal findings: Secondary | ICD-10-CM | POA: Diagnosis not present

## 2016-11-24 DIAGNOSIS — M8588 Other specified disorders of bone density and structure, other site: Secondary | ICD-10-CM | POA: Diagnosis not present

## 2016-11-24 DIAGNOSIS — J4520 Mild intermittent asthma, uncomplicated: Secondary | ICD-10-CM | POA: Diagnosis not present

## 2016-12-16 DIAGNOSIS — H9201 Otalgia, right ear: Secondary | ICD-10-CM | POA: Diagnosis not present

## 2017-01-05 ENCOUNTER — Ambulatory Visit
Admission: RE | Admit: 2017-01-05 | Discharge: 2017-01-05 | Disposition: A | Discharge status: Home / Self Care | Payer: PPO | Source: Ambulatory Visit | Attending: Family Medicine | Admitting: Family Medicine

## 2017-01-05 DIAGNOSIS — Z1231 Encounter for screening mammogram for malignant neoplasm of breast: Secondary | ICD-10-CM

## 2017-01-05 HISTORY — DX: Personal history of irradiation: Z92.3

## 2017-01-19 DIAGNOSIS — Z23 Encounter for immunization: Secondary | ICD-10-CM | POA: Diagnosis not present

## 2017-03-17 DIAGNOSIS — J301 Allergic rhinitis due to pollen: Secondary | ICD-10-CM | POA: Diagnosis not present

## 2017-04-13 IMAGING — CR DG ELBOW 2V*L*
2 series · 2 of 2 positions shown · non-contrast
Comparison: None.

CLINICAL DATA: Fell on outstretched hand.

EXAM:
LEFT ELBOW - 2 VIEW

[elbow ap]
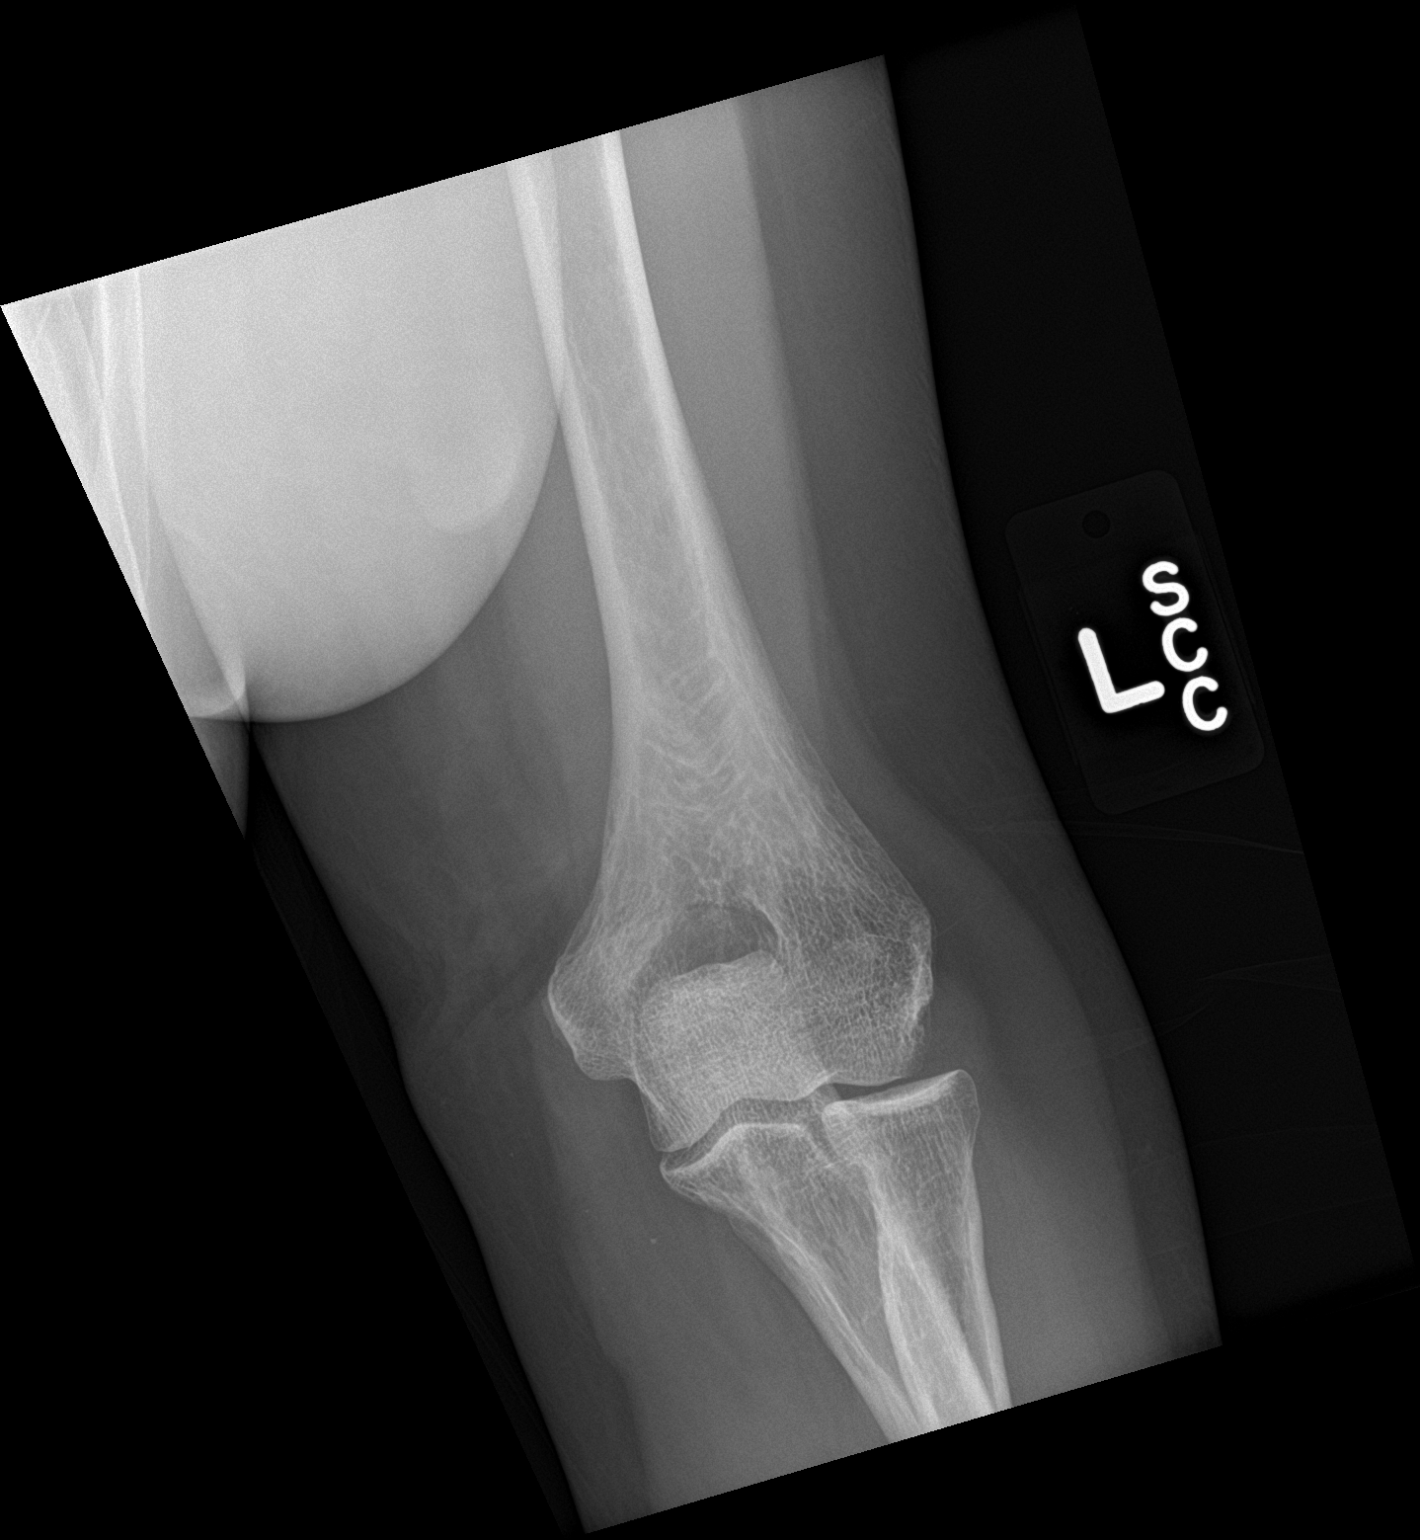

[elbow lat]
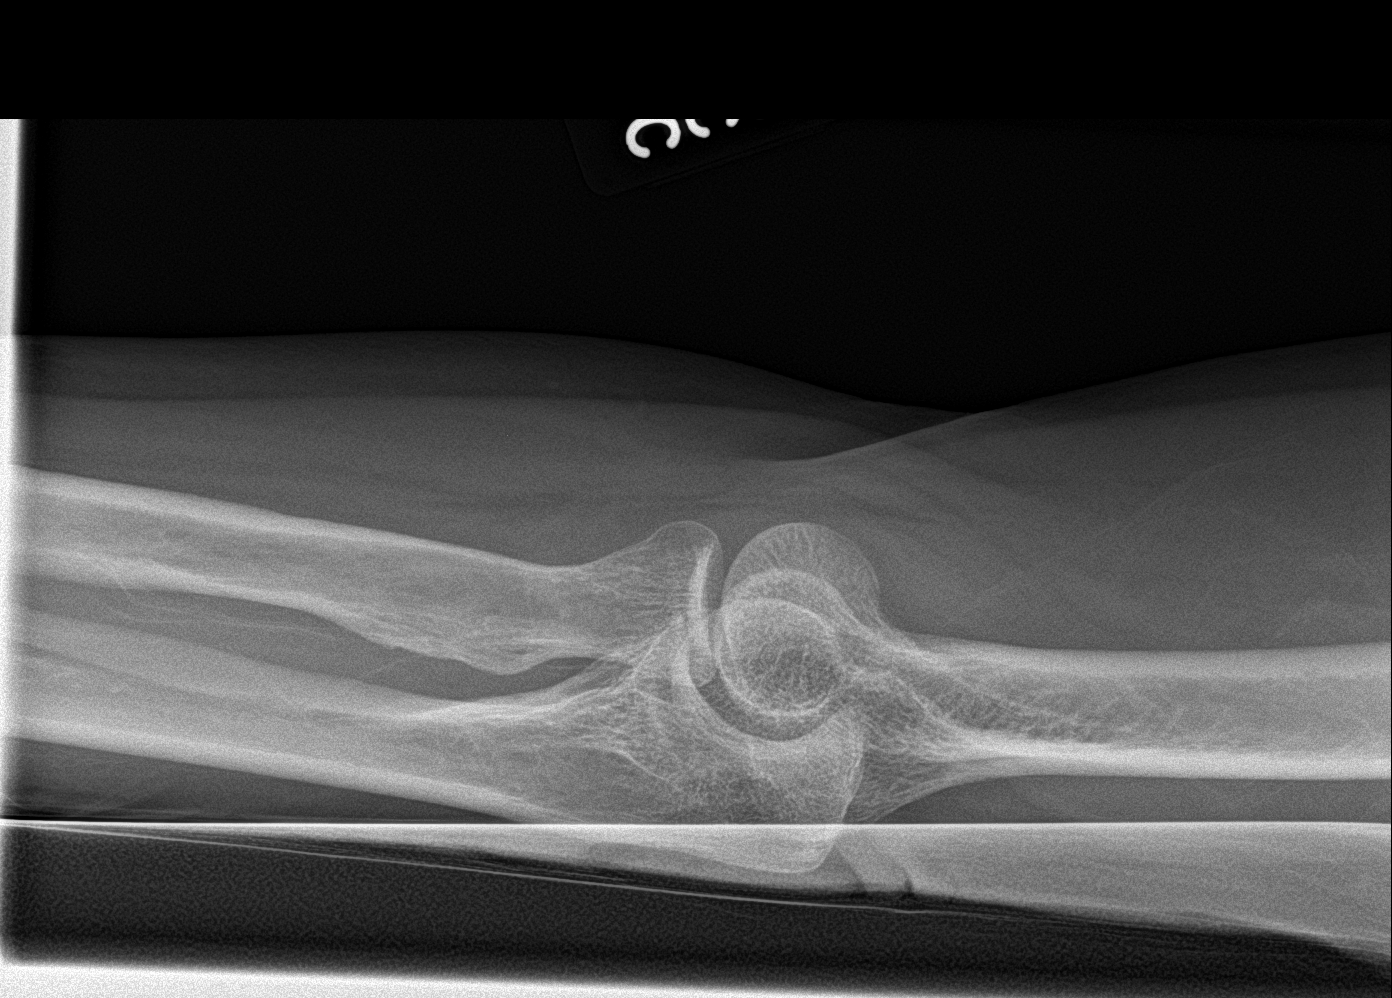

[2 of 2 positions shown; findings below may reference images not displayed]

FINDINGS: There is no evidence of fracture, dislocation, or joint effusion.
There is no evidence of arthropathy or other focal bone abnormality.
Soft tissues are unremarkable.
IMPRESSION: Negative.

## 2017-04-13 IMAGING — CR DG CHEST 1V
1 series · 1 of 1 positions shown · non-contrast
Comparison: CT chest 10/05/2013

CLINICAL DATA: Fell on outstretched hand

EXAM:
CHEST 1 VIEW

[chest ap]
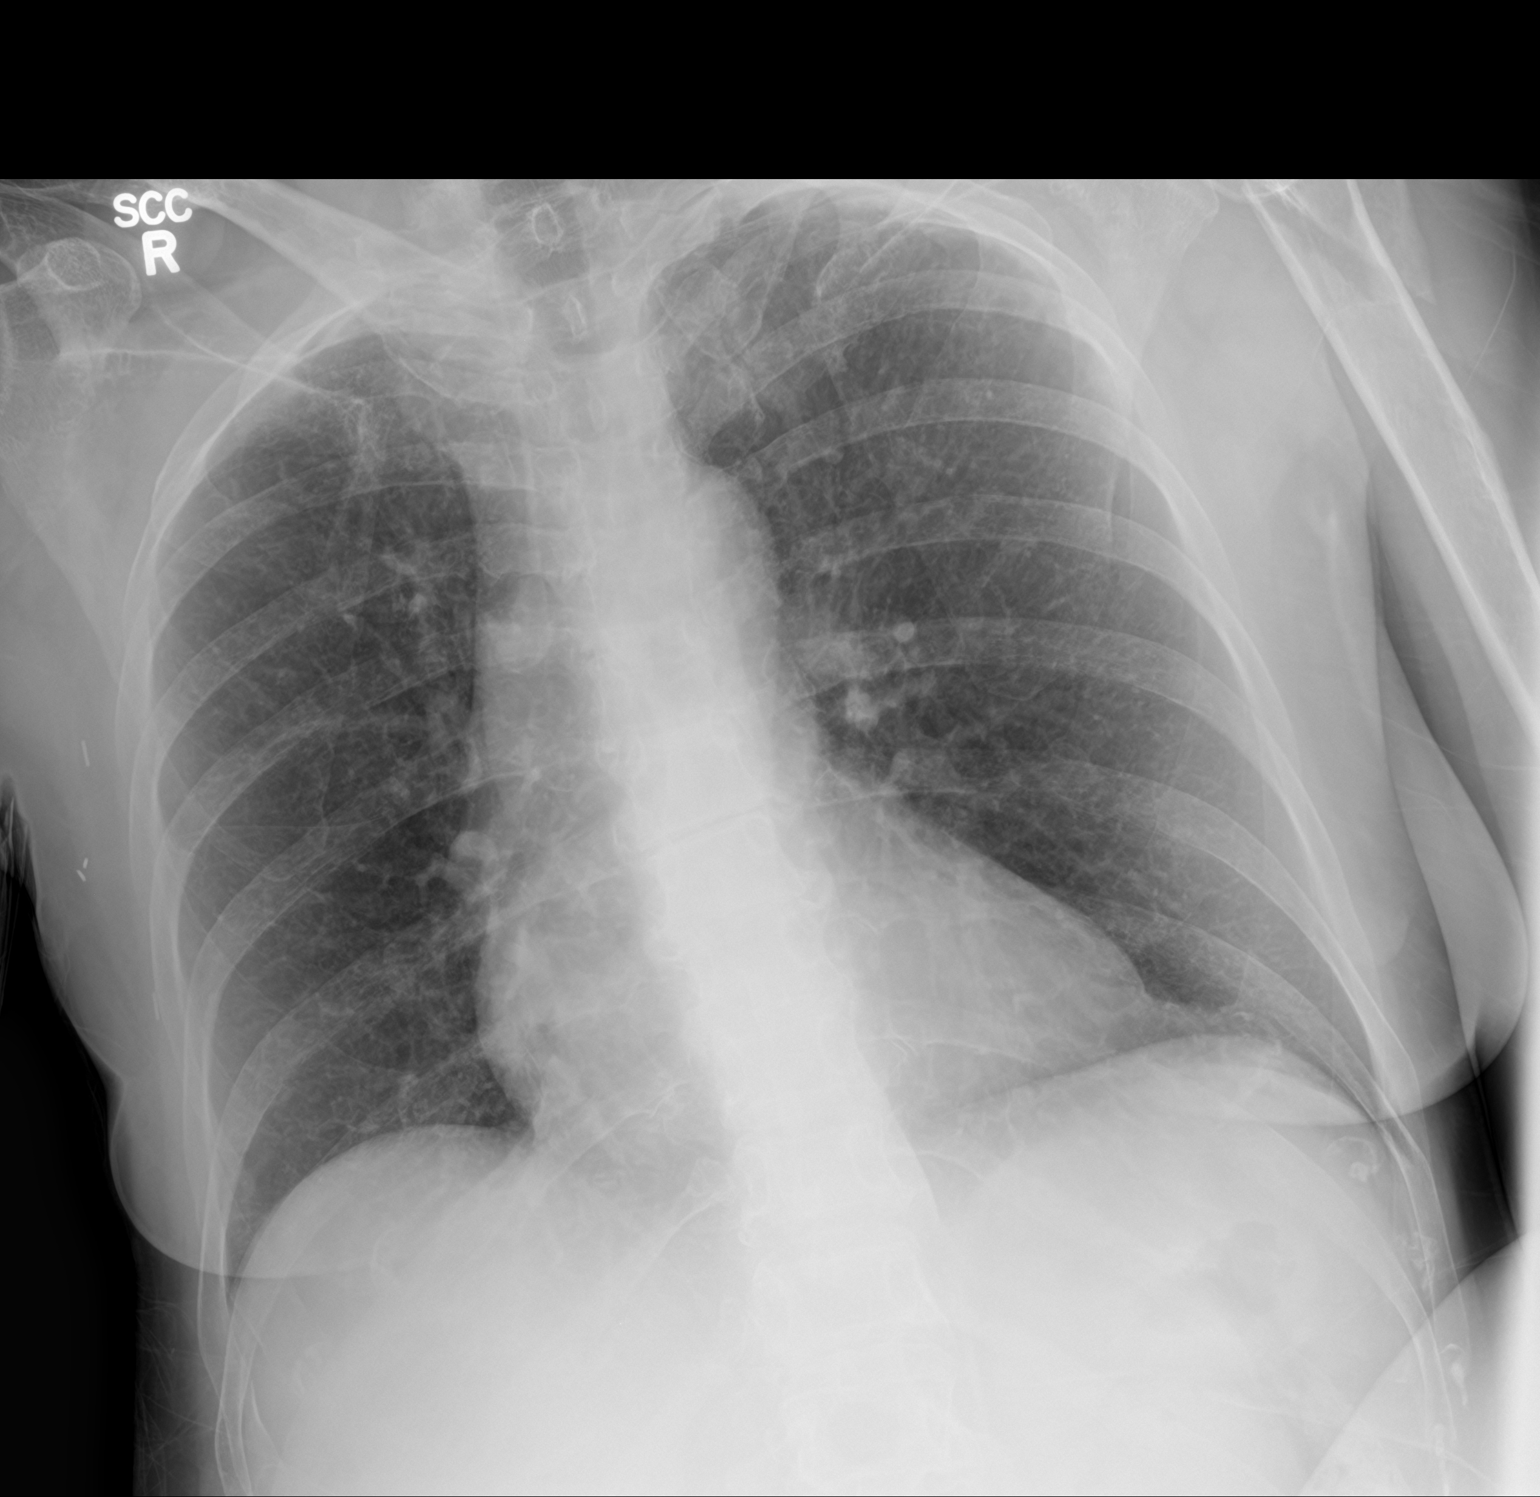

[1 of 1 positions shown; findings below may reference images not displayed]

FINDINGS: Heart size and vascularity normal. Probable COPD. Negative for
pneumonia. No infiltrate effusion or mass lesion. Lungs are clear.
IMPRESSION: No active disease.

## 2017-04-13 IMAGING — CR DG FOREARM 2V*L*
2 series · 2 of 2 positions shown · non-contrast
Comparison: None.

CLINICAL DATA: Fell on outstretched hand

EXAM:
LEFT FOREARM - 2 VIEW

[forearm ap]
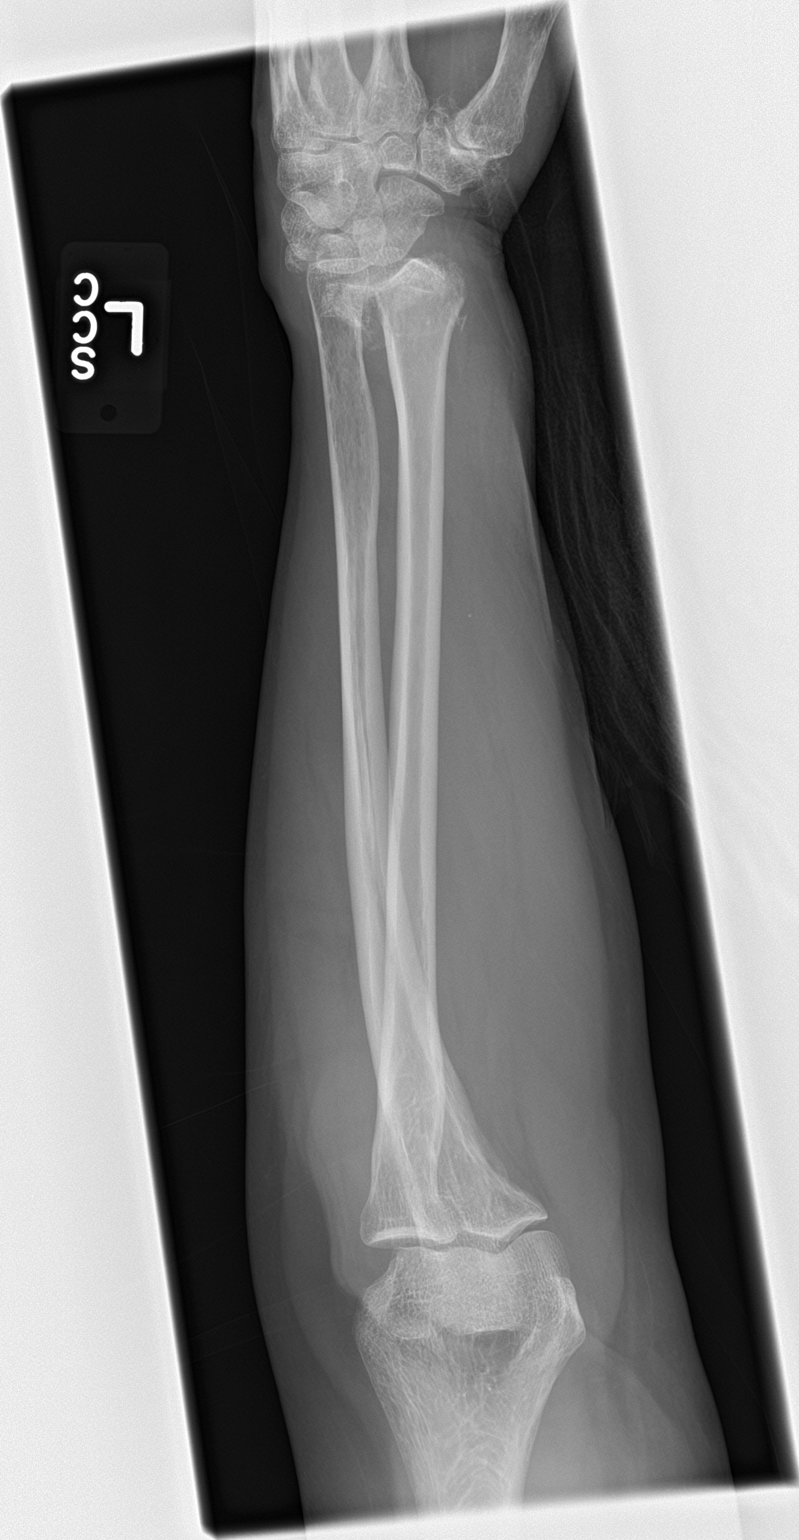

[forearm lat]
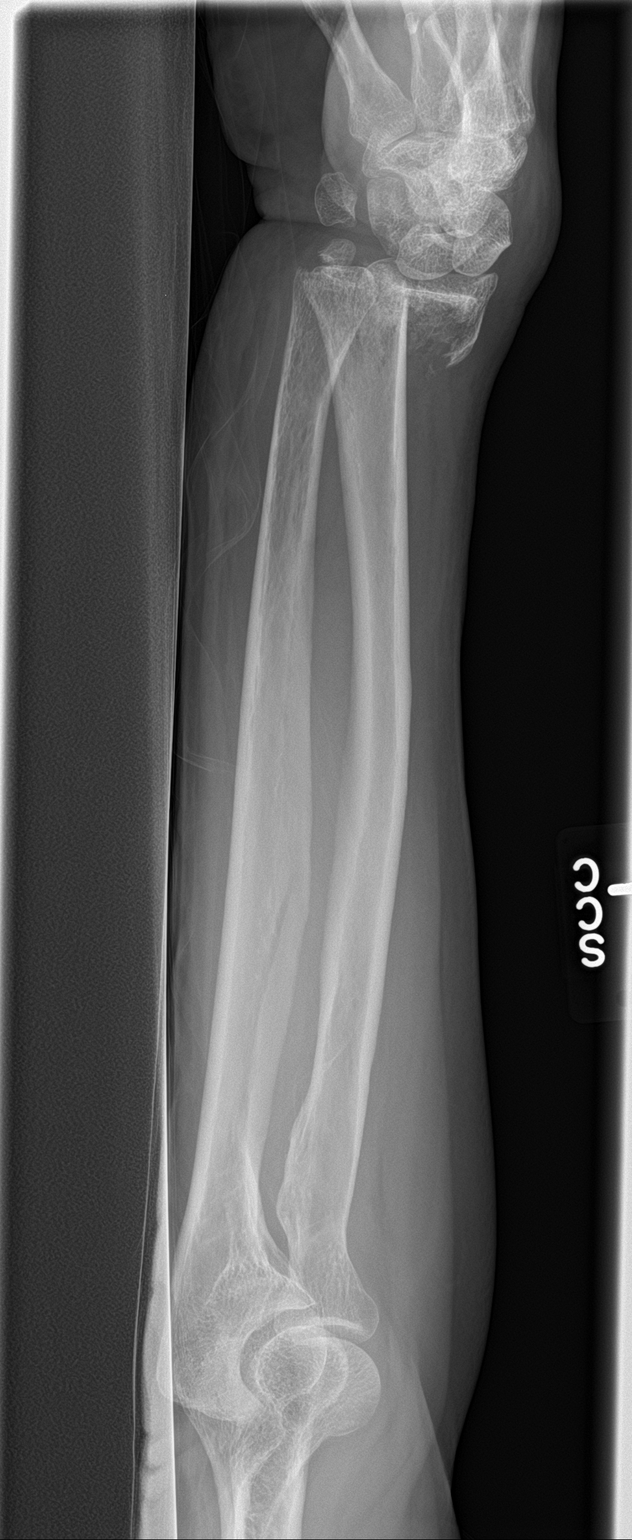

[2 of 2 positions shown; findings below may reference images not displayed]

FINDINGS: Fracture of the distal radius with dorsal displacement. Fracture of
the ulnar styloid. No other fracture of the radius or ulna.
IMPRESSION: Displaced fracture distal radius. Mildly displaced fracture ulnar
styloid.

## 2017-04-13 IMAGING — CR DG HAND 2V*L*
2 series · 2 of 2 positions shown · non-contrast
Comparison: None.

CLINICAL DATA: Fell on outstretched hand

EXAM:
LEFT HAND - 2 VIEW

[hand pa]
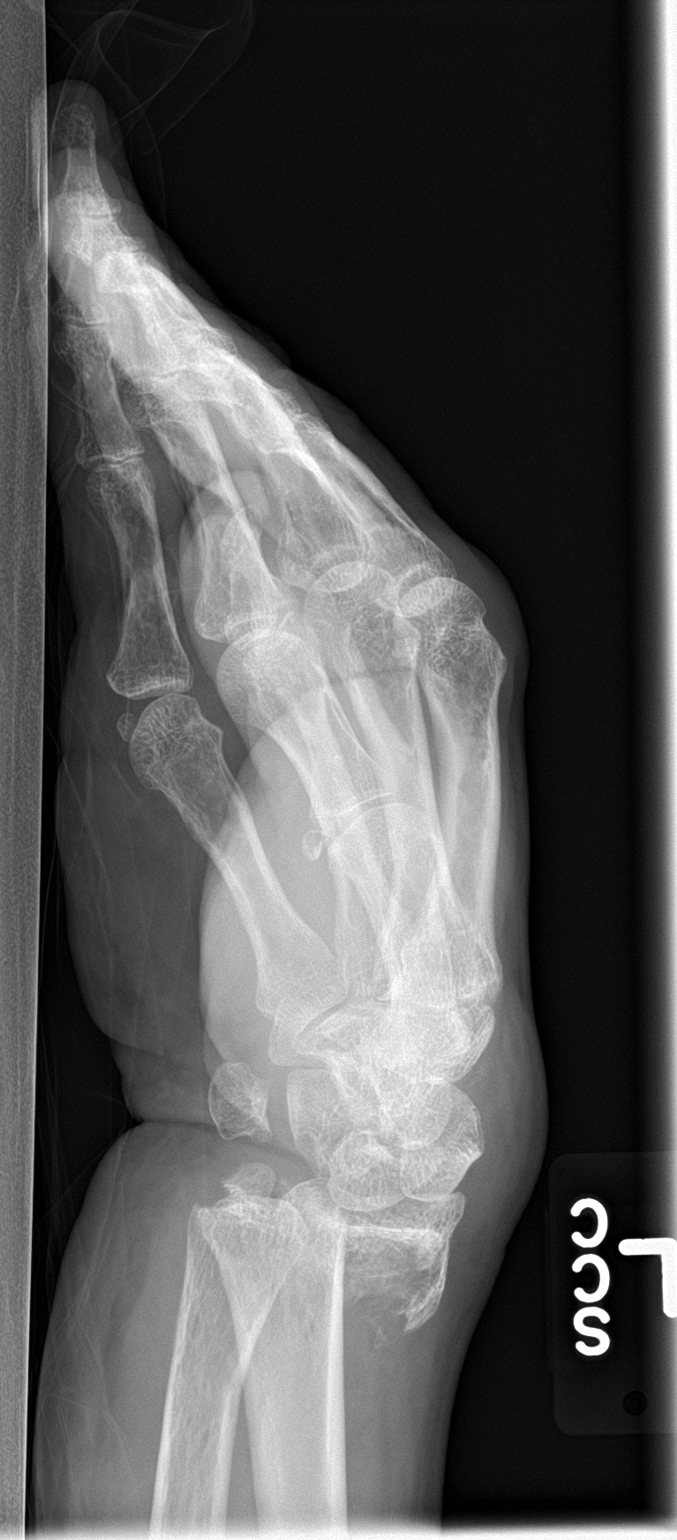

[hand lat]
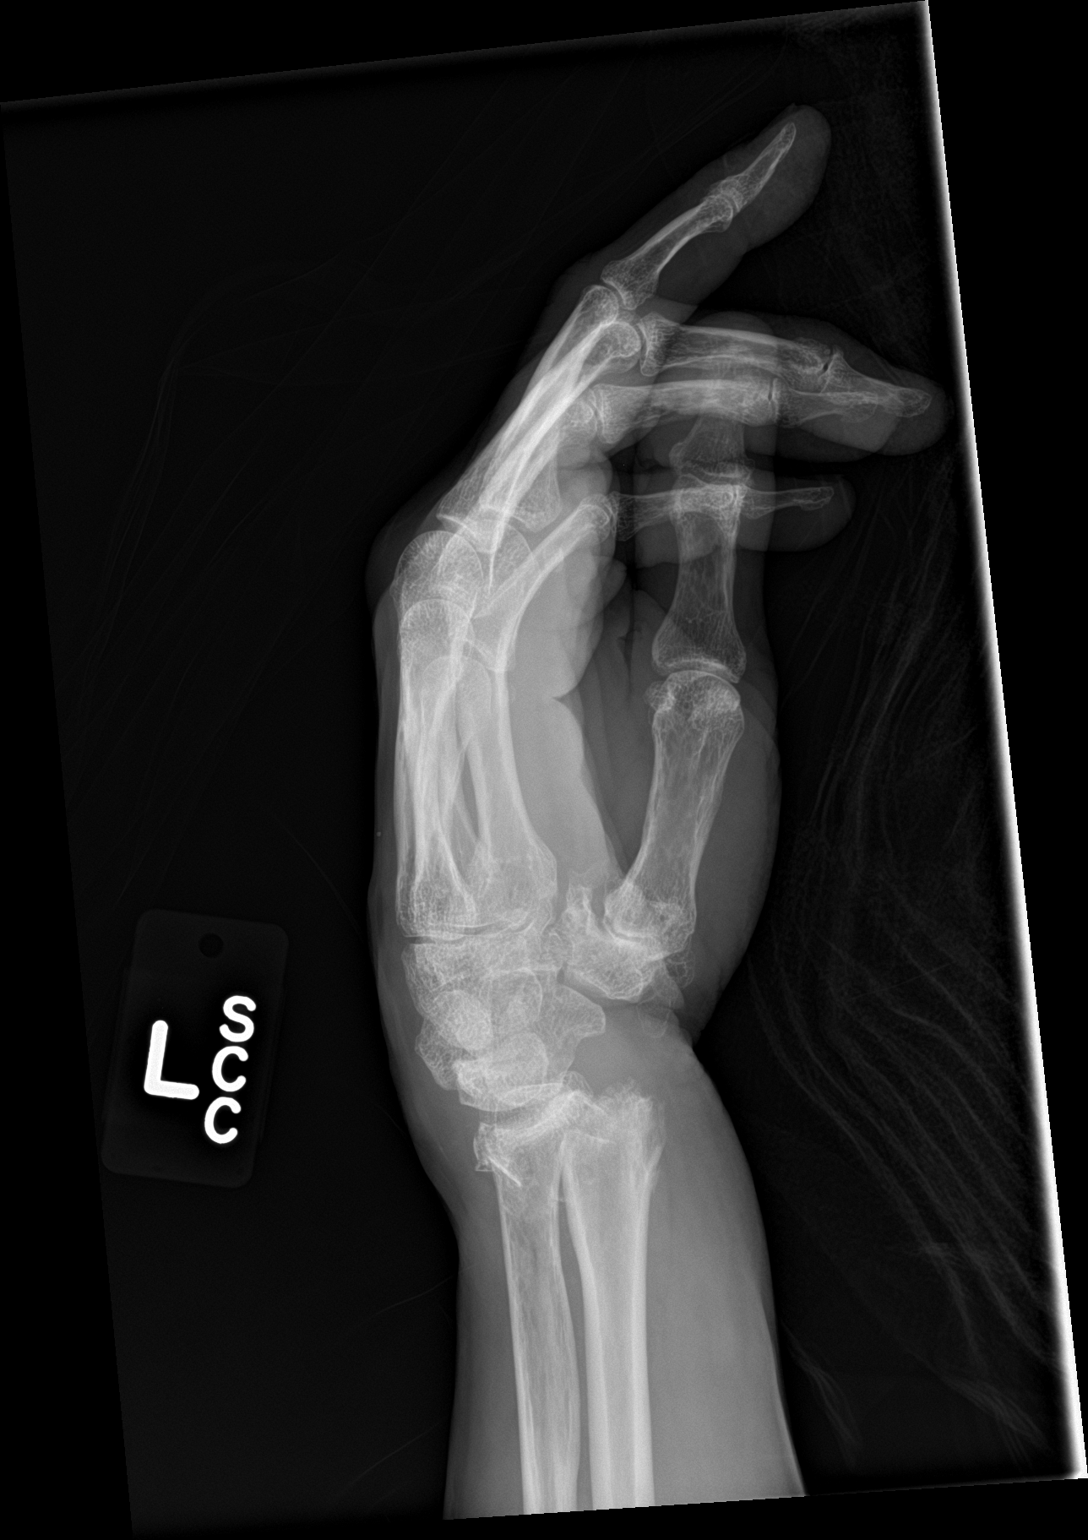

[2 of 2 positions shown; findings below may reference images not displayed]

FINDINGS: Transverse fracture distal radius with dorsal displacement. Fracture
of the ulnar styloid

Degenerative change moderate to advanced at base of thumb. Limited
imaging of the hand due to positioning difficulty. No other
fracture.
IMPRESSION: Fracture distal radius and ulna.

## 2017-04-13 IMAGING — CR DG WRIST 2V*L*
2 series · 2 of 2 positions shown · non-contrast
Comparison: None.

CLINICAL DATA: Fell on outstretched hand

EXAM:
LEFT WRIST - 2 VIEW

[wrist pa]
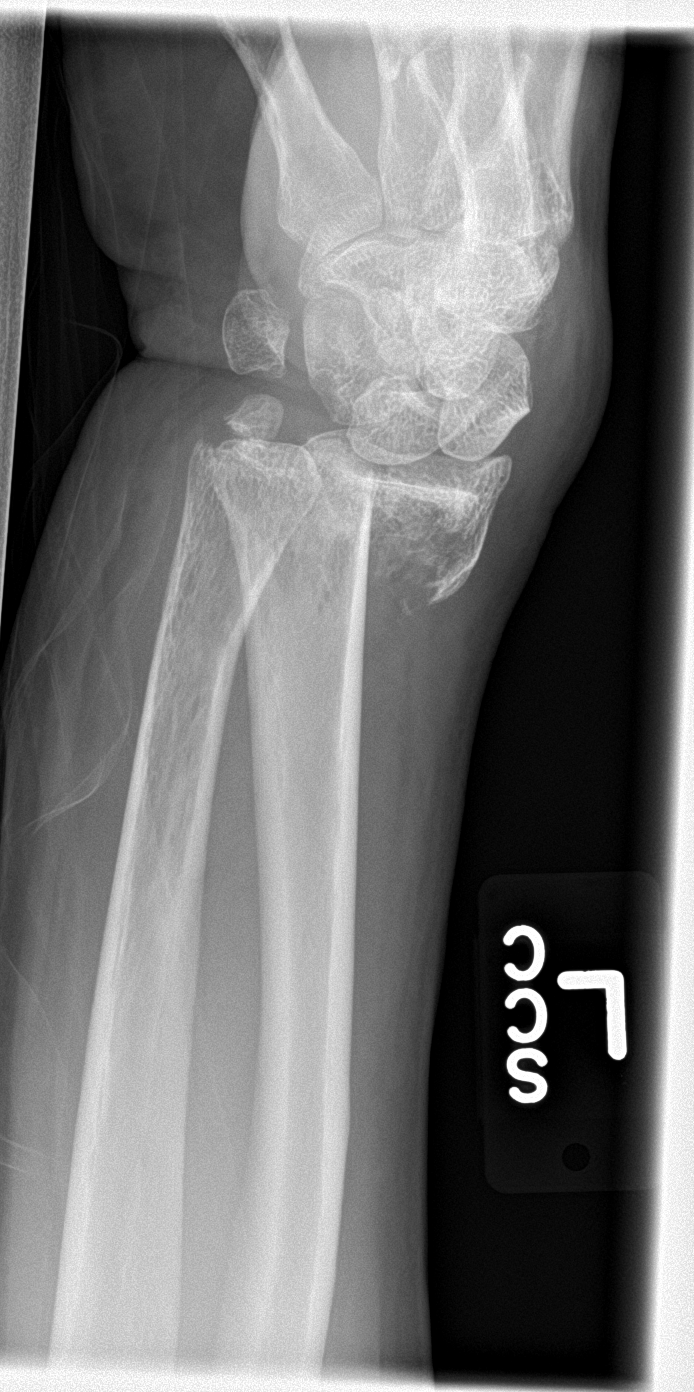

[wrist lat]
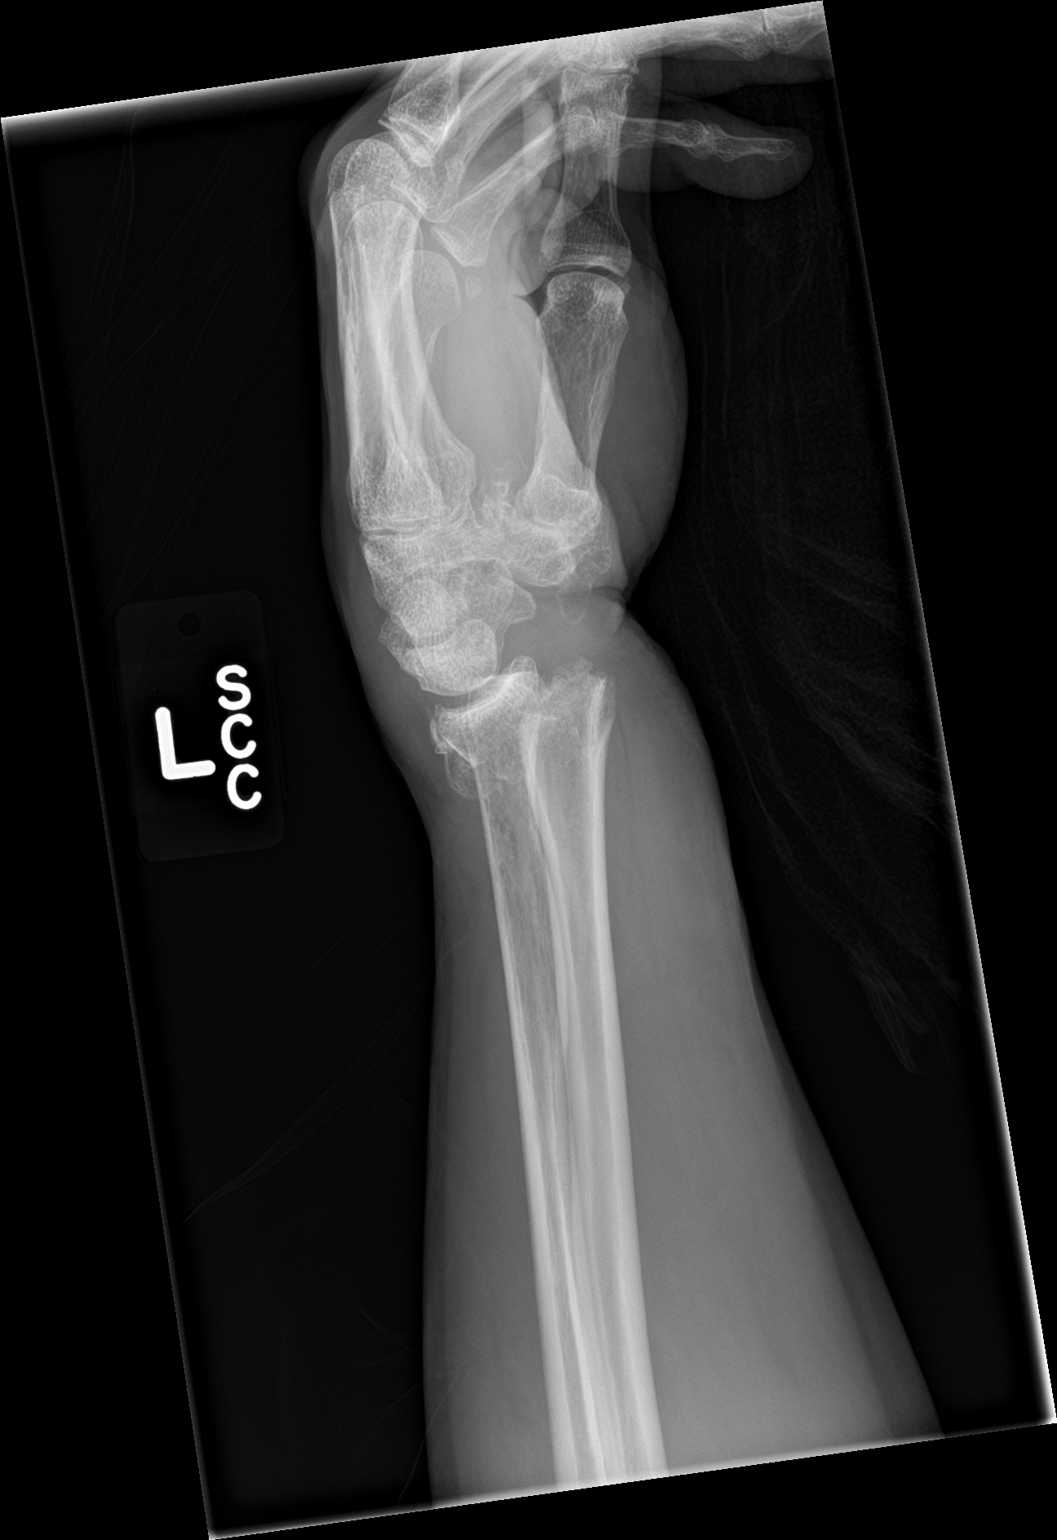

[2 of 2 positions shown; findings below may reference images not displayed]

FINDINGS: Transverse fracture distal radius with dorsal displacement of nearly
1 shaft width. Ulnar styloid fracture.

Degenerative change at the base of thumb.
IMPRESSION: Dorsally displaced fracture distal radius. Ulnar styloid also
fractured.

## 2017-04-22 ENCOUNTER — Other Ambulatory Visit: Payer: Self-pay | Admitting: Family Medicine

## 2017-04-22 DIAGNOSIS — Z1231 Encounter for screening mammogram for malignant neoplasm of breast: Secondary | ICD-10-CM

## 2017-05-07 DIAGNOSIS — H40033 Anatomical narrow angle, bilateral: Secondary | ICD-10-CM | POA: Diagnosis not present

## 2017-05-07 DIAGNOSIS — H1013 Acute atopic conjunctivitis, bilateral: Secondary | ICD-10-CM | POA: Diagnosis not present

## 2017-05-27 DIAGNOSIS — H1010 Acute atopic conjunctivitis, unspecified eye: Secondary | ICD-10-CM | POA: Diagnosis not present

## 2017-05-27 DIAGNOSIS — I10 Essential (primary) hypertension: Secondary | ICD-10-CM | POA: Diagnosis not present

## 2018-01-04 DIAGNOSIS — H1132 Conjunctival hemorrhage, left eye: Secondary | ICD-10-CM | POA: Diagnosis not present

## 2018-01-04 DIAGNOSIS — Z23 Encounter for immunization: Secondary | ICD-10-CM | POA: Diagnosis not present

## 2018-01-07 ENCOUNTER — Ambulatory Visit
Admission: RE | Admit: 2018-01-07 | Discharge: 2018-01-07 | Disposition: A | Discharge status: Home / Self Care | Payer: PPO | Source: Ambulatory Visit | Attending: Family Medicine | Admitting: Family Medicine

## 2018-01-07 DIAGNOSIS — Z1231 Encounter for screening mammogram for malignant neoplasm of breast: Secondary | ICD-10-CM

## 2018-02-26 DIAGNOSIS — Z853 Personal history of malignant neoplasm of breast: Secondary | ICD-10-CM | POA: Diagnosis not present

## 2018-02-26 DIAGNOSIS — J4520 Mild intermittent asthma, uncomplicated: Secondary | ICD-10-CM | POA: Diagnosis not present

## 2018-02-26 DIAGNOSIS — M8000XA Age-related osteoporosis with current pathological fracture, unspecified site, initial encounter for fracture: Secondary | ICD-10-CM | POA: Diagnosis not present

## 2018-02-26 DIAGNOSIS — Z0000 Encounter for general adult medical examination without abnormal findings: Secondary | ICD-10-CM | POA: Diagnosis not present

## 2018-02-26 DIAGNOSIS — J301 Allergic rhinitis due to pollen: Secondary | ICD-10-CM | POA: Diagnosis not present

## 2018-02-26 DIAGNOSIS — I10 Essential (primary) hypertension: Secondary | ICD-10-CM | POA: Diagnosis not present

## 2018-02-26 DIAGNOSIS — Z1389 Encounter for screening for other disorder: Secondary | ICD-10-CM | POA: Diagnosis not present

## 2018-02-26 DIAGNOSIS — Z800 Family history of malignant neoplasm of digestive organs: Secondary | ICD-10-CM | POA: Diagnosis not present

## 2018-02-26 DIAGNOSIS — B078 Other viral warts: Secondary | ICD-10-CM | POA: Diagnosis not present

## 2018-02-26 DIAGNOSIS — N951 Menopausal and female climacteric states: Secondary | ICD-10-CM | POA: Diagnosis not present

## 2018-03-01 DIAGNOSIS — M8588 Other specified disorders of bone density and structure, other site: Secondary | ICD-10-CM | POA: Diagnosis not present

## 2018-03-01 DIAGNOSIS — M810 Age-related osteoporosis without current pathological fracture: Secondary | ICD-10-CM | POA: Diagnosis not present

## 2018-04-12 DIAGNOSIS — E876 Hypokalemia: Secondary | ICD-10-CM | POA: Diagnosis not present

## 2018-05-06 DIAGNOSIS — H1013 Acute atopic conjunctivitis, bilateral: Secondary | ICD-10-CM | POA: Diagnosis not present

## 2018-05-06 DIAGNOSIS — H40033 Anatomical narrow angle, bilateral: Secondary | ICD-10-CM | POA: Diagnosis not present

## 2018-08-27 ENCOUNTER — Other Ambulatory Visit: Payer: Self-pay | Admitting: Family Medicine

## 2018-08-27 DIAGNOSIS — Z1231 Encounter for screening mammogram for malignant neoplasm of breast: Secondary | ICD-10-CM

## 2019-01-08 DIAGNOSIS — Z23 Encounter for immunization: Secondary | ICD-10-CM | POA: Diagnosis not present

## 2019-01-10 ENCOUNTER — Other Ambulatory Visit: Payer: Self-pay

## 2019-01-10 ENCOUNTER — Ambulatory Visit
Admission: RE | Admit: 2019-01-10 | Discharge: 2019-01-10 | Disposition: A | Discharge status: Home / Self Care | Payer: PPO | Source: Ambulatory Visit | Attending: Family Medicine | Admitting: Family Medicine

## 2019-01-10 DIAGNOSIS — Z1231 Encounter for screening mammogram for malignant neoplasm of breast: Secondary | ICD-10-CM | POA: Diagnosis not present

## 2019-03-08 DIAGNOSIS — Z853 Personal history of malignant neoplasm of breast: Secondary | ICD-10-CM | POA: Diagnosis not present

## 2019-03-08 DIAGNOSIS — Z1389 Encounter for screening for other disorder: Secondary | ICD-10-CM | POA: Diagnosis not present

## 2019-03-08 DIAGNOSIS — J4520 Mild intermittent asthma, uncomplicated: Secondary | ICD-10-CM | POA: Diagnosis not present

## 2019-03-08 DIAGNOSIS — Z800 Family history of malignant neoplasm of digestive organs: Secondary | ICD-10-CM | POA: Diagnosis not present

## 2019-03-08 DIAGNOSIS — Z0000 Encounter for general adult medical examination without abnormal findings: Secondary | ICD-10-CM | POA: Diagnosis not present

## 2019-03-08 DIAGNOSIS — N951 Menopausal and female climacteric states: Secondary | ICD-10-CM | POA: Diagnosis not present

## 2019-03-08 DIAGNOSIS — I10 Essential (primary) hypertension: Secondary | ICD-10-CM | POA: Diagnosis not present

## 2019-03-08 DIAGNOSIS — M8000XA Age-related osteoporosis with current pathological fracture, unspecified site, initial encounter for fracture: Secondary | ICD-10-CM | POA: Diagnosis not present

## 2019-03-08 DIAGNOSIS — J301 Allergic rhinitis due to pollen: Secondary | ICD-10-CM | POA: Diagnosis not present

## 2019-05-05 DIAGNOSIS — H40033 Anatomical narrow angle, bilateral: Secondary | ICD-10-CM | POA: Diagnosis not present

## 2019-05-05 DIAGNOSIS — H1013 Acute atopic conjunctivitis, bilateral: Secondary | ICD-10-CM | POA: Diagnosis not present

## 2019-08-09 DIAGNOSIS — J4520 Mild intermittent asthma, uncomplicated: Secondary | ICD-10-CM | POA: Diagnosis not present

## 2019-08-09 DIAGNOSIS — J301 Allergic rhinitis due to pollen: Secondary | ICD-10-CM | POA: Diagnosis not present

## 2019-09-09 DIAGNOSIS — J4520 Mild intermittent asthma, uncomplicated: Secondary | ICD-10-CM | POA: Diagnosis not present

## 2019-09-09 DIAGNOSIS — I10 Essential (primary) hypertension: Secondary | ICD-10-CM | POA: Diagnosis not present

## 2019-09-09 DIAGNOSIS — J301 Allergic rhinitis due to pollen: Secondary | ICD-10-CM | POA: Diagnosis not present

## 2019-12-05 ENCOUNTER — Other Ambulatory Visit: Payer: Self-pay | Admitting: Family Medicine

## 2019-12-05 DIAGNOSIS — Z1231 Encounter for screening mammogram for malignant neoplasm of breast: Secondary | ICD-10-CM

## 2020-01-11 ENCOUNTER — Ambulatory Visit
Admission: RE | Admit: 2020-01-11 | Discharge: 2020-01-11 | Disposition: A | Discharge status: Home / Self Care | Payer: PPO | Source: Ambulatory Visit | Attending: Family Medicine | Admitting: Family Medicine

## 2020-01-11 DIAGNOSIS — Z1231 Encounter for screening mammogram for malignant neoplasm of breast: Secondary | ICD-10-CM | POA: Diagnosis not present

## 2020-01-21 DIAGNOSIS — Z23 Encounter for immunization: Secondary | ICD-10-CM | POA: Diagnosis not present

## 2020-04-02 ENCOUNTER — Other Ambulatory Visit: Payer: Self-pay | Admitting: Family Medicine

## 2020-04-02 DIAGNOSIS — Z0000 Encounter for general adult medical examination without abnormal findings: Secondary | ICD-10-CM | POA: Diagnosis not present

## 2020-04-02 DIAGNOSIS — J4520 Mild intermittent asthma, uncomplicated: Secondary | ICD-10-CM | POA: Diagnosis not present

## 2020-04-02 DIAGNOSIS — Z853 Personal history of malignant neoplasm of breast: Secondary | ICD-10-CM | POA: Diagnosis not present

## 2020-04-02 DIAGNOSIS — I10 Essential (primary) hypertension: Secondary | ICD-10-CM | POA: Diagnosis not present

## 2020-04-02 DIAGNOSIS — Z1389 Encounter for screening for other disorder: Secondary | ICD-10-CM | POA: Diagnosis not present

## 2020-04-02 DIAGNOSIS — N951 Menopausal and female climacteric states: Secondary | ICD-10-CM | POA: Diagnosis not present

## 2020-04-02 DIAGNOSIS — J301 Allergic rhinitis due to pollen: Secondary | ICD-10-CM | POA: Diagnosis not present

## 2020-04-02 DIAGNOSIS — Z800 Family history of malignant neoplasm of digestive organs: Secondary | ICD-10-CM | POA: Diagnosis not present

## 2020-04-02 DIAGNOSIS — M810 Age-related osteoporosis without current pathological fracture: Secondary | ICD-10-CM | POA: Diagnosis not present

## 2020-04-02 DIAGNOSIS — Z1159 Encounter for screening for other viral diseases: Secondary | ICD-10-CM | POA: Diagnosis not present

## 2020-04-02 DIAGNOSIS — M84442D Pathological fracture, left hand, subsequent encounter for fracture with routine healing: Secondary | ICD-10-CM | POA: Diagnosis not present

## 2020-05-03 DIAGNOSIS — H2513 Age-related nuclear cataract, bilateral: Secondary | ICD-10-CM | POA: Diagnosis not present

## 2020-05-03 DIAGNOSIS — H40033 Anatomical narrow angle, bilateral: Secondary | ICD-10-CM | POA: Diagnosis not present

## 2020-07-10 ENCOUNTER — Other Ambulatory Visit: Payer: PPO

## 2020-07-18 ENCOUNTER — Other Ambulatory Visit: Payer: PPO

## 2020-07-19 DIAGNOSIS — Z780 Asymptomatic menopausal state: Secondary | ICD-10-CM | POA: Diagnosis not present

## 2020-07-19 DIAGNOSIS — M8589 Other specified disorders of bone density and structure, multiple sites: Secondary | ICD-10-CM | POA: Diagnosis not present

## 2020-10-04 DIAGNOSIS — Z7189 Other specified counseling: Secondary | ICD-10-CM | POA: Diagnosis not present

## 2020-10-04 DIAGNOSIS — I10 Essential (primary) hypertension: Secondary | ICD-10-CM | POA: Diagnosis not present

## 2020-10-04 DIAGNOSIS — J301 Allergic rhinitis due to pollen: Secondary | ICD-10-CM | POA: Diagnosis not present

## 2020-10-04 DIAGNOSIS — J4520 Mild intermittent asthma, uncomplicated: Secondary | ICD-10-CM | POA: Diagnosis not present

## 2020-12-03 ENCOUNTER — Other Ambulatory Visit: Payer: Self-pay | Admitting: Family Medicine

## 2020-12-03 DIAGNOSIS — Z1231 Encounter for screening mammogram for malignant neoplasm of breast: Secondary | ICD-10-CM

## 2020-12-14 ENCOUNTER — Other Ambulatory Visit: Payer: PPO

## 2021-01-12 DIAGNOSIS — Z23 Encounter for immunization: Secondary | ICD-10-CM | POA: Diagnosis not present

## 2021-01-23 ENCOUNTER — Ambulatory Visit
Admission: RE | Admit: 2021-01-23 | Discharge: 2021-01-23 | Disposition: A | Discharge status: Home / Self Care | Payer: PPO | Source: Ambulatory Visit | Attending: Family Medicine | Admitting: Family Medicine

## 2021-01-23 ENCOUNTER — Other Ambulatory Visit: Payer: Self-pay

## 2021-01-23 DIAGNOSIS — Z1231 Encounter for screening mammogram for malignant neoplasm of breast: Secondary | ICD-10-CM | POA: Diagnosis not present

## 2021-04-11 DIAGNOSIS — Z1389 Encounter for screening for other disorder: Secondary | ICD-10-CM | POA: Diagnosis not present

## 2021-04-11 DIAGNOSIS — J301 Allergic rhinitis due to pollen: Secondary | ICD-10-CM | POA: Diagnosis not present

## 2021-04-11 DIAGNOSIS — Z853 Personal history of malignant neoplasm of breast: Secondary | ICD-10-CM | POA: Diagnosis not present

## 2021-04-11 DIAGNOSIS — R197 Diarrhea, unspecified: Secondary | ICD-10-CM | POA: Diagnosis not present

## 2021-04-11 DIAGNOSIS — Z800 Family history of malignant neoplasm of digestive organs: Secondary | ICD-10-CM | POA: Diagnosis not present

## 2021-04-11 DIAGNOSIS — Z0000 Encounter for general adult medical examination without abnormal findings: Secondary | ICD-10-CM | POA: Diagnosis not present

## 2021-04-11 DIAGNOSIS — I10 Essential (primary) hypertension: Secondary | ICD-10-CM | POA: Diagnosis not present

## 2021-04-11 DIAGNOSIS — M8000XA Age-related osteoporosis with current pathological fracture, unspecified site, initial encounter for fracture: Secondary | ICD-10-CM | POA: Diagnosis not present

## 2021-04-11 DIAGNOSIS — M810 Age-related osteoporosis without current pathological fracture: Secondary | ICD-10-CM | POA: Diagnosis not present

## 2021-04-11 DIAGNOSIS — N951 Menopausal and female climacteric states: Secondary | ICD-10-CM | POA: Diagnosis not present

## 2021-04-11 DIAGNOSIS — J4520 Mild intermittent asthma, uncomplicated: Secondary | ICD-10-CM | POA: Diagnosis not present

## 2021-05-04 DIAGNOSIS — H40033 Anatomical narrow angle, bilateral: Secondary | ICD-10-CM | POA: Diagnosis not present

## 2021-05-04 DIAGNOSIS — H2513 Age-related nuclear cataract, bilateral: Secondary | ICD-10-CM | POA: Diagnosis not present

## 2021-05-15 DIAGNOSIS — K648 Other hemorrhoids: Secondary | ICD-10-CM | POA: Diagnosis not present

## 2021-05-15 DIAGNOSIS — Z800 Family history of malignant neoplasm of digestive organs: Secondary | ICD-10-CM | POA: Diagnosis not present

## 2021-10-10 DIAGNOSIS — J4520 Mild intermittent asthma, uncomplicated: Secondary | ICD-10-CM | POA: Diagnosis not present

## 2021-10-10 DIAGNOSIS — Z23 Encounter for immunization: Secondary | ICD-10-CM | POA: Diagnosis not present

## 2021-10-10 DIAGNOSIS — J301 Allergic rhinitis due to pollen: Secondary | ICD-10-CM | POA: Diagnosis not present

## 2021-10-10 DIAGNOSIS — I10 Essential (primary) hypertension: Secondary | ICD-10-CM | POA: Diagnosis not present

## 2021-12-17 ENCOUNTER — Other Ambulatory Visit: Payer: Self-pay | Admitting: Family Medicine

## 2021-12-17 DIAGNOSIS — Z1231 Encounter for screening mammogram for malignant neoplasm of breast: Secondary | ICD-10-CM

## 2022-01-24 ENCOUNTER — Ambulatory Visit
Admission: RE | Admit: 2022-01-24 | Discharge: 2022-01-24 | Disposition: A | Discharge status: Home / Self Care | Payer: PPO | Source: Ambulatory Visit | Attending: Family Medicine | Admitting: Family Medicine

## 2022-01-24 ENCOUNTER — Ambulatory Visit: Payer: PPO

## 2022-01-24 DIAGNOSIS — Z1231 Encounter for screening mammogram for malignant neoplasm of breast: Secondary | ICD-10-CM | POA: Diagnosis not present

## 2022-02-06 DIAGNOSIS — R42 Dizziness and giddiness: Secondary | ICD-10-CM | POA: Diagnosis not present

## 2022-02-06 DIAGNOSIS — I10 Essential (primary) hypertension: Secondary | ICD-10-CM | POA: Diagnosis not present

## 2022-04-15 DIAGNOSIS — Z853 Personal history of malignant neoplasm of breast: Secondary | ICD-10-CM | POA: Diagnosis not present

## 2022-04-15 DIAGNOSIS — R7301 Impaired fasting glucose: Secondary | ICD-10-CM | POA: Diagnosis not present

## 2022-04-15 DIAGNOSIS — Z0000 Encounter for general adult medical examination without abnormal findings: Secondary | ICD-10-CM | POA: Diagnosis not present

## 2022-04-15 DIAGNOSIS — M8000XA Age-related osteoporosis with current pathological fracture, unspecified site, initial encounter for fracture: Secondary | ICD-10-CM | POA: Diagnosis not present

## 2022-04-15 DIAGNOSIS — I10 Essential (primary) hypertension: Secondary | ICD-10-CM | POA: Diagnosis not present

## 2022-04-15 DIAGNOSIS — Z13220 Encounter for screening for lipoid disorders: Secondary | ICD-10-CM | POA: Diagnosis not present

## 2022-04-15 DIAGNOSIS — J4520 Mild intermittent asthma, uncomplicated: Secondary | ICD-10-CM | POA: Diagnosis not present

## 2022-04-15 DIAGNOSIS — J301 Allergic rhinitis due to pollen: Secondary | ICD-10-CM | POA: Diagnosis not present

## 2022-04-15 DIAGNOSIS — N951 Menopausal and female climacteric states: Secondary | ICD-10-CM | POA: Diagnosis not present

## 2022-04-15 DIAGNOSIS — Z800 Family history of malignant neoplasm of digestive organs: Secondary | ICD-10-CM | POA: Diagnosis not present

## 2022-04-16 DIAGNOSIS — Z0000 Encounter for general adult medical examination without abnormal findings: Secondary | ICD-10-CM | POA: Diagnosis not present

## 2022-04-16 DIAGNOSIS — Z136 Encounter for screening for cardiovascular disorders: Secondary | ICD-10-CM | POA: Diagnosis not present

## 2022-04-16 DIAGNOSIS — R7301 Impaired fasting glucose: Secondary | ICD-10-CM | POA: Diagnosis not present

## 2022-04-16 DIAGNOSIS — I10 Essential (primary) hypertension: Secondary | ICD-10-CM | POA: Diagnosis not present

## 2022-05-03 DIAGNOSIS — H2513 Age-related nuclear cataract, bilateral: Secondary | ICD-10-CM | POA: Diagnosis not present

## 2022-05-03 DIAGNOSIS — H40033 Anatomical narrow angle, bilateral: Secondary | ICD-10-CM | POA: Diagnosis not present

## 2022-10-14 DIAGNOSIS — I10 Essential (primary) hypertension: Secondary | ICD-10-CM | POA: Diagnosis not present

## 2022-10-14 DIAGNOSIS — R7303 Prediabetes: Secondary | ICD-10-CM | POA: Diagnosis not present

## 2022-10-14 DIAGNOSIS — J301 Allergic rhinitis due to pollen: Secondary | ICD-10-CM | POA: Diagnosis not present

## 2022-10-14 DIAGNOSIS — M8000XA Age-related osteoporosis with current pathological fracture, unspecified site, initial encounter for fracture: Secondary | ICD-10-CM | POA: Diagnosis not present

## 2022-10-14 DIAGNOSIS — J4520 Mild intermittent asthma, uncomplicated: Secondary | ICD-10-CM | POA: Diagnosis not present

## 2022-10-14 DIAGNOSIS — Z853 Personal history of malignant neoplasm of breast: Secondary | ICD-10-CM | POA: Diagnosis not present

## 2022-12-16 ENCOUNTER — Other Ambulatory Visit: Payer: Self-pay | Admitting: Internal Medicine

## 2022-12-16 DIAGNOSIS — Z0000 Encounter for general adult medical examination without abnormal findings: Secondary | ICD-10-CM

## 2023-01-26 ENCOUNTER — Ambulatory Visit: Payer: PPO

## 2023-03-03 ENCOUNTER — Ambulatory Visit
Admission: RE | Admit: 2023-03-03 | Discharge: 2023-03-03 | Disposition: A | Discharge status: Home / Self Care | Payer: PPO | Source: Ambulatory Visit | Attending: Internal Medicine | Admitting: Internal Medicine

## 2023-03-03 DIAGNOSIS — Z0000 Encounter for general adult medical examination without abnormal findings: Secondary | ICD-10-CM

## 2023-03-03 DIAGNOSIS — Z1231 Encounter for screening mammogram for malignant neoplasm of breast: Secondary | ICD-10-CM | POA: Diagnosis not present

## 2023-04-28 DIAGNOSIS — I10 Essential (primary) hypertension: Secondary | ICD-10-CM | POA: Diagnosis not present

## 2023-04-28 DIAGNOSIS — R7303 Prediabetes: Secondary | ICD-10-CM | POA: Diagnosis not present

## 2023-04-28 DIAGNOSIS — Z800 Family history of malignant neoplasm of digestive organs: Secondary | ICD-10-CM | POA: Diagnosis not present

## 2023-04-28 DIAGNOSIS — Z0000 Encounter for general adult medical examination without abnormal findings: Secondary | ICD-10-CM | POA: Diagnosis not present

## 2023-04-28 DIAGNOSIS — J4520 Mild intermittent asthma, uncomplicated: Secondary | ICD-10-CM | POA: Diagnosis not present

## 2023-04-28 DIAGNOSIS — Z853 Personal history of malignant neoplasm of breast: Secondary | ICD-10-CM | POA: Diagnosis not present

## 2023-04-28 DIAGNOSIS — M8000XA Age-related osteoporosis with current pathological fracture, unspecified site, initial encounter for fracture: Secondary | ICD-10-CM | POA: Diagnosis not present

## 2023-04-28 DIAGNOSIS — N951 Menopausal and female climacteric states: Secondary | ICD-10-CM | POA: Diagnosis not present

## 2023-04-28 DIAGNOSIS — Z13820 Encounter for screening for osteoporosis: Secondary | ICD-10-CM | POA: Diagnosis not present

## 2023-04-28 DIAGNOSIS — J301 Allergic rhinitis due to pollen: Secondary | ICD-10-CM | POA: Diagnosis not present

## 2023-07-06 DIAGNOSIS — I10 Essential (primary) hypertension: Secondary | ICD-10-CM | POA: Diagnosis not present

## 2023-07-06 DIAGNOSIS — R600 Localized edema: Secondary | ICD-10-CM | POA: Diagnosis not present

## 2023-07-06 DIAGNOSIS — R21 Rash and other nonspecific skin eruption: Secondary | ICD-10-CM | POA: Diagnosis not present

## 2023-08-24 DIAGNOSIS — I10 Essential (primary) hypertension: Secondary | ICD-10-CM | POA: Diagnosis not present

## 2023-08-24 DIAGNOSIS — R600 Localized edema: Secondary | ICD-10-CM | POA: Diagnosis not present

## 2023-10-28 ENCOUNTER — Other Ambulatory Visit (HOSPITAL_BASED_OUTPATIENT_CLINIC_OR_DEPARTMENT_OTHER): Payer: Self-pay | Admitting: Internal Medicine

## 2023-10-28 DIAGNOSIS — R600 Localized edema: Secondary | ICD-10-CM | POA: Diagnosis not present

## 2023-10-28 DIAGNOSIS — M8000XA Age-related osteoporosis with current pathological fracture, unspecified site, initial encounter for fracture: Secondary | ICD-10-CM | POA: Diagnosis not present

## 2023-10-28 DIAGNOSIS — J4520 Mild intermittent asthma, uncomplicated: Secondary | ICD-10-CM | POA: Diagnosis not present

## 2023-10-28 DIAGNOSIS — I10 Essential (primary) hypertension: Secondary | ICD-10-CM | POA: Diagnosis not present

## 2023-10-28 DIAGNOSIS — M8588 Other specified disorders of bone density and structure, other site: Secondary | ICD-10-CM

## 2023-10-28 DIAGNOSIS — J301 Allergic rhinitis due to pollen: Secondary | ICD-10-CM | POA: Diagnosis not present

## 2023-10-28 DIAGNOSIS — Z853 Personal history of malignant neoplasm of breast: Secondary | ICD-10-CM | POA: Diagnosis not present

## 2023-10-28 DIAGNOSIS — R7303 Prediabetes: Secondary | ICD-10-CM | POA: Diagnosis not present

## 2024-01-29 ENCOUNTER — Other Ambulatory Visit: Payer: Self-pay | Admitting: Internal Medicine

## 2024-01-29 DIAGNOSIS — Z1231 Encounter for screening mammogram for malignant neoplasm of breast: Secondary | ICD-10-CM

## 2024-03-03 ENCOUNTER — Ambulatory Visit
Admission: RE | Admit: 2024-03-03 | Discharge: 2024-03-03 | Disposition: A | Discharge status: Home / Self Care | Source: Ambulatory Visit | Attending: Internal Medicine | Admitting: Internal Medicine

## 2024-03-03 DIAGNOSIS — Z1231 Encounter for screening mammogram for malignant neoplasm of breast: Secondary | ICD-10-CM | POA: Diagnosis not present
